# Patient Record
Sex: Female | Born: 1970 | Race: White | Hispanic: No | Smoking: Never smoker
Health system: Southern US, Community
[De-identification: ages and names within clinical notes are randomized; demographics above are authoritative.]

## PROBLEM LIST (undated history)

## (undated) DIAGNOSIS — F419 Anxiety disorder, unspecified: Secondary | ICD-10-CM

## (undated) DIAGNOSIS — J45909 Unspecified asthma, uncomplicated: Secondary | ICD-10-CM

## (undated) HISTORY — PX: ABDOMINAL HYSTERECTOMY: SHX81

---

## 2004-05-01 ENCOUNTER — Emergency Department: Payer: Self-pay | Admitting: Emergency Medicine

## 2004-08-14 ENCOUNTER — Emergency Department: Payer: Self-pay | Admitting: Emergency Medicine

## 2005-01-07 ENCOUNTER — Emergency Department: Payer: Self-pay | Admitting: Emergency Medicine

## 2005-02-07 ENCOUNTER — Emergency Department: Payer: Self-pay | Admitting: Emergency Medicine

## 2005-04-11 ENCOUNTER — Emergency Department: Payer: Self-pay | Admitting: General Practice

## 2007-05-31 DIAGNOSIS — S21009A Unspecified open wound of unspecified breast, initial encounter: Secondary | ICD-10-CM | POA: Insufficient documentation

## 2007-08-06 DIAGNOSIS — J45909 Unspecified asthma, uncomplicated: Secondary | ICD-10-CM | POA: Insufficient documentation

## 2008-05-27 ENCOUNTER — Ambulatory Visit: Payer: Self-pay

## 2008-08-08 DIAGNOSIS — L0292 Furuncle, unspecified: Secondary | ICD-10-CM | POA: Insufficient documentation

## 2008-12-19 DIAGNOSIS — L679 Hair color and hair shaft abnormality, unspecified: Secondary | ICD-10-CM | POA: Insufficient documentation

## 2008-12-19 DIAGNOSIS — R519 Headache, unspecified: Secondary | ICD-10-CM | POA: Insufficient documentation

## 2009-01-06 DIAGNOSIS — E663 Overweight: Secondary | ICD-10-CM | POA: Insufficient documentation

## 2009-03-20 ENCOUNTER — Ambulatory Visit: Payer: Self-pay | Admitting: Family Medicine

## 2009-04-11 HISTORY — PX: CARPAL TUNNEL RELEASE: SHX101

## 2009-09-10 DIAGNOSIS — G4700 Insomnia, unspecified: Secondary | ICD-10-CM | POA: Insufficient documentation

## 2009-10-26 DIAGNOSIS — N951 Menopausal and female climacteric states: Secondary | ICD-10-CM | POA: Insufficient documentation

## 2009-11-09 DIAGNOSIS — M2550 Pain in unspecified joint: Secondary | ICD-10-CM | POA: Insufficient documentation

## 2009-11-09 LAB — POCT ERYTHROCYTE SEDIMENTATION RATE, NON-AUTOMATED: Sed Rate: 8 mm

## 2010-04-07 ENCOUNTER — Emergency Department: Payer: Self-pay | Admitting: Emergency Medicine

## 2010-07-05 ENCOUNTER — Ambulatory Visit: Payer: Self-pay | Admitting: Family Medicine

## 2010-09-20 ENCOUNTER — Ambulatory Visit: Payer: Self-pay | Admitting: Orthopedic Surgery

## 2011-03-08 LAB — HEPATIC FUNCTION PANEL
ALT: 16 U/L (ref 7–35)
AST: 15 U/L (ref 13–35)
Alkaline Phosphatase: 68 U/L (ref 25–125)
Bilirubin, Total: 0.3 mg/dL

## 2011-03-08 LAB — CBC AND DIFFERENTIAL
HCT: 42 % (ref 36–46)
Hemoglobin: 14.4 g/dL (ref 12.0–16.0)
Neutrophils Absolute: 55 /uL
Platelets: 247 10*3/uL (ref 150–399)
WBC: 6.3 10^3/mL

## 2011-03-08 LAB — BASIC METABOLIC PANEL
BUN: 15 mg/dL (ref 4–21)
Glucose: 1 mg/dL
Potassium: 3.8 mmol/L (ref 3.4–5.3)
Sodium: 141 mmol/L (ref 137–147)

## 2011-03-08 LAB — TSH: TSH: 1.67 u[IU]/mL (ref 0.41–5.90)

## 2011-10-02 ENCOUNTER — Emergency Department: Payer: Self-pay | Admitting: Emergency Medicine

## 2011-10-02 LAB — BASIC METABOLIC PANEL
Anion Gap: 12 (ref 7–16)
BUN: 11 mg/dL (ref 7–18)
Calcium, Total: 8.6 mg/dL (ref 8.5–10.1)
Chloride: 106 mmol/L (ref 98–107)
Co2: 25 mmol/L (ref 21–32)
Creatinine: 0.72 mg/dL (ref 0.60–1.30)
EGFR (African American): >60
EGFR (Non-African Amer.): >60
Glucose: 87 mg/dL (ref 65–99)
Osmolality: 284 (ref 275–301)
Potassium: 3.6 mmol/L (ref 3.5–5.1)
Sodium: 143 mmol/L (ref 136–145)

## 2011-10-02 LAB — TROPONIN I: Troponin-I: <0.02 ng/mL

## 2011-10-02 LAB — CK TOTAL AND CKMB (NOT AT ARMC)
CK, Total: 177 U/L (ref 21–215)
CK-MB: 2.6 ng/mL (ref 0.5–3.6)

## 2011-10-02 LAB — CBC
HCT: 40.9 % (ref 35.0–47.0)
HGB: 13.9 g/dL (ref 12.0–16.0)
MCH: 28.8 pg (ref 26.0–34.0)
MCHC: 34 g/dL (ref 32.0–36.0)
MCV: 85 fL (ref 80–100)
Platelet: 227 10*3/uL (ref 150–440)
RBC: 4.84 10*6/uL (ref 3.80–5.20)
RDW: 13 % (ref 11.5–14.5)
WBC: 5.9 10*3/uL (ref 3.6–11.0)

## 2012-09-10 ENCOUNTER — Ambulatory Visit: Payer: Self-pay | Admitting: Obstetrics and Gynecology

## 2012-09-10 LAB — BASIC METABOLIC PANEL
Anion Gap: 4 — ABNORMAL LOW (ref 7–16)
BUN: 15 mg/dL (ref 7–18)
Calcium, Total: 8.8 mg/dL (ref 8.5–10.1)
Chloride: 104 mmol/L (ref 98–107)
Co2: 30 mmol/L (ref 21–32)
Creatinine: 0.82 mg/dL (ref 0.60–1.30)
EGFR (African American): >60
EGFR (Non-African Amer.): >60
Glucose: 89 mg/dL (ref 65–99)
Osmolality: 276 (ref 275–301)
Potassium: 3.9 mmol/L (ref 3.5–5.1)
Sodium: 138 mmol/L (ref 136–145)

## 2012-09-10 LAB — CBC
HCT: 39.3 % (ref 35.0–47.0)
HGB: 13.5 g/dL (ref 12.0–16.0)
MCH: 28.5 pg (ref 26.0–34.0)
MCHC: 34.3 g/dL (ref 32.0–36.0)
MCV: 83 fL (ref 80–100)
Platelet: 204 10*3/uL (ref 150–440)
RBC: 4.73 10*6/uL (ref 3.80–5.20)
RDW: 13.1 % (ref 11.5–14.5)
WBC: 5.4 10*3/uL (ref 3.6–11.0)

## 2012-09-20 ENCOUNTER — Ambulatory Visit: Payer: Self-pay | Admitting: Obstetrics and Gynecology

## 2012-09-20 HISTORY — PX: INCONTINENCE SURGERY: SHX676

## 2012-10-01 ENCOUNTER — Emergency Department: Payer: Self-pay | Admitting: Emergency Medicine

## 2012-10-01 LAB — CBC WITH DIFFERENTIAL/PLATELET
Basophil #: 0.1 10*3/uL (ref 0.0–0.1)
Basophil %: 1.1 %
Eosinophil #: 0.3 10*3/uL (ref 0.0–0.7)
Eosinophil %: 5.2 %
HCT: 38 % (ref 35.0–47.0)
HGB: 13.2 g/dL (ref 12.0–16.0)
Lymphocyte #: 2 10*3/uL (ref 1.0–3.6)
Lymphocyte %: 36.6 %
MCH: 28.7 pg (ref 26.0–34.0)
MCHC: 34.8 g/dL (ref 32.0–36.0)
MCV: 83 fL (ref 80–100)
Monocyte #: 0.3 x10 3/mm (ref 0.2–0.9)
Monocyte %: 6.4 %
Neutrophil #: 2.7 10*3/uL (ref 1.4–6.5)
Neutrophil %: 50.7 %
Platelet: 231 10*3/uL (ref 150–440)
RBC: 4.6 10*6/uL (ref 3.80–5.20)
RDW: 13 % (ref 11.5–14.5)
WBC: 5.3 10*3/uL (ref 3.6–11.0)

## 2012-10-01 LAB — BASIC METABOLIC PANEL
Anion Gap: 4 — ABNORMAL LOW (ref 7–16)
BUN: 18 mg/dL (ref 7–18)
Calcium, Total: 9 mg/dL (ref 8.5–10.1)
Chloride: 105 mmol/L (ref 98–107)
Co2: 30 mmol/L (ref 21–32)
Creatinine: 0.8 mg/dL (ref 0.60–1.30)
EGFR (African American): >60
EGFR (Non-African Amer.): >60
Glucose: 101 mg/dL — ABNORMAL HIGH (ref 65–99)
Osmolality: 280 (ref 275–301)
Potassium: 3.8 mmol/L (ref 3.5–5.1)
Sodium: 139 mmol/L (ref 136–145)

## 2012-10-01 LAB — SEDIMENTATION RATE: Erythrocyte Sed Rate: 18 mm/hr (ref 0–20)

## 2012-10-11 ENCOUNTER — Other Ambulatory Visit: Payer: Self-pay | Admitting: Neurosurgery

## 2012-10-11 ENCOUNTER — Encounter (HOSPITAL_COMMUNITY): Payer: Self-pay | Admitting: Certified Registered Nurse Anesthetist

## 2012-10-11 ENCOUNTER — Encounter (HOSPITAL_COMMUNITY): Payer: Self-pay | Admitting: Pharmacy Technician

## 2012-10-11 ENCOUNTER — Encounter (HOSPITAL_COMMUNITY)
Admission: AD | Disposition: A | Discharge status: Home / Self Care | Payer: Self-pay | Source: Ambulatory Visit | Attending: Neurosurgery

## 2012-10-11 ENCOUNTER — Ambulatory Visit (HOSPITAL_COMMUNITY): Payer: BC Managed Care – PPO

## 2012-10-11 ENCOUNTER — Encounter (HOSPITAL_COMMUNITY): Payer: Self-pay | Admitting: *Deleted

## 2012-10-11 ENCOUNTER — Ambulatory Visit (HOSPITAL_COMMUNITY): Payer: BC Managed Care – PPO | Admitting: Certified Registered Nurse Anesthetist

## 2012-10-11 ENCOUNTER — Inpatient Hospital Stay (HOSPITAL_COMMUNITY)
Admission: AD | Admit: 2012-10-11 | Discharge: 2012-10-13 | DRG: 865 | Disposition: A | Discharge status: Home / Self Care | Payer: BC Managed Care – PPO | Source: Ambulatory Visit | Attending: Neurosurgery | Admitting: Neurosurgery

## 2012-10-11 DIAGNOSIS — J45909 Unspecified asthma, uncomplicated: Secondary | ICD-10-CM | POA: Diagnosis present

## 2012-10-11 DIAGNOSIS — Z794 Long term (current) use of insulin: Secondary | ICD-10-CM

## 2012-10-11 DIAGNOSIS — Z880 Allergy status to penicillin: Secondary | ICD-10-CM

## 2012-10-11 DIAGNOSIS — M4802 Spinal stenosis, cervical region: Principal | ICD-10-CM | POA: Diagnosis present

## 2012-10-11 DIAGNOSIS — F411 Generalized anxiety disorder: Secondary | ICD-10-CM | POA: Diagnosis present

## 2012-10-11 HISTORY — PX: ANTERIOR CERVICAL DECOMP/DISCECTOMY FUSION: SHX1161

## 2012-10-11 HISTORY — DX: Unspecified asthma, uncomplicated: J45.909

## 2012-10-11 HISTORY — DX: Anxiety disorder, unspecified: F41.9

## 2012-10-11 LAB — CBC
HCT: 40.3 % (ref 36.0–46.0)
Hemoglobin: 13.7 g/dL (ref 12.0–15.0)
MCH: 28.1 pg (ref 26.0–34.0)
MCHC: 34 g/dL (ref 30.0–36.0)
MCV: 82.8 fL (ref 78.0–100.0)
Platelets: 211 10*3/uL (ref 150–400)
RBC: 4.87 MIL/uL (ref 3.87–5.11)
RDW: 12.5 % (ref 11.5–15.5)
WBC: 6.9 10*3/uL (ref 4.0–10.5)

## 2012-10-11 LAB — SURGICAL PCR SCREEN
MRSA, PCR: NEGATIVE
Staphylococcus aureus: NEGATIVE

## 2012-10-11 SURGERY — ANTERIOR CERVICAL DECOMPRESSION/DISCECTOMY FUSION 2 LEVELS
Anesthesia: General | Wound class: Clean

## 2012-10-11 MED ORDER — DIAZEPAM 5 MG PO TABS
5.0000 mg | ORAL_TABLET | Freq: Four times a day (QID) | ORAL | Status: DC | PRN
  Administered 2012-10-12 (×3): 5 mg via ORAL
  Filled 2012-10-11 (×3): qty 1

## 2012-10-11 MED ORDER — SODIUM CHLORIDE 0.9 % IV SOLN: 250.0000 mL | INTRAVENOUS | Status: DC

## 2012-10-11 MED ORDER — ACETAMINOPHEN 650 MG RE SUPP: 650.0000 mg | RECTAL | Status: DC | PRN

## 2012-10-11 MED ORDER — ONDANSETRON HCL 4 MG/2ML IJ SOLN
INTRAMUSCULAR | Status: DC | PRN
  Administered 2012-10-11: 4 mg via INTRAVENOUS

## 2012-10-11 MED ORDER — PHENOL 1.4 % MT LIQD: 1.0000 | OROMUCOSAL | Status: DC | PRN

## 2012-10-11 MED ORDER — OXYCODONE-ACETAMINOPHEN 5-325 MG PO TABS
1.0000 | ORAL_TABLET | ORAL | Status: DC | PRN
  Administered 2012-10-13 (×2): 2 via ORAL
  Filled 2012-10-11 (×3): qty 2

## 2012-10-11 MED ORDER — SODIUM CHLORIDE 0.9 % IJ SOLN
3.0000 mL | Freq: Two times a day (BID) | INTRAMUSCULAR | Status: DC
  Administered 2012-10-11: 3 mL via INTRAVENOUS

## 2012-10-11 MED ORDER — MORPHINE SULFATE 2 MG/ML IJ SOLN
1.0000 mg | INTRAMUSCULAR | Status: DC | PRN
  Administered 2012-10-11 – 2012-10-12 (×2): 4 mg via INTRAVENOUS
  Administered 2012-10-12: 2 mg via INTRAVENOUS
  Administered 2012-10-12: 4 mg via INTRAVENOUS
  Administered 2012-10-12: 2 mg via INTRAVENOUS
  Filled 2012-10-11: qty 1
  Filled 2012-10-11 (×3): qty 2
  Filled 2012-10-11: qty 1

## 2012-10-11 MED ORDER — HYDROMORPHONE HCL PF 1 MG/ML IJ SOLN
INTRAMUSCULAR | Status: DC | PRN
  Administered 2012-10-11: 0.5 mg via INTRAVENOUS

## 2012-10-11 MED ORDER — HYDROXYZINE HCL 50 MG PO TABS
50.0000 mg | ORAL_TABLET | Freq: Three times a day (TID) | ORAL | Status: DC | PRN
  Filled 2012-10-11: qty 1

## 2012-10-11 MED ORDER — EPHEDRINE SULFATE 50 MG/ML IJ SOLN
INTRAMUSCULAR | Status: DC | PRN
  Administered 2012-10-11: 5 mg via INTRAVENOUS

## 2012-10-11 MED ORDER — SODIUM CHLORIDE 0.9 % IJ SOLN: 3.0000 mL | INTRAMUSCULAR | Status: DC | PRN

## 2012-10-11 MED ORDER — ROCURONIUM BROMIDE 100 MG/10ML IV SOLN
INTRAVENOUS | Status: DC | PRN
  Administered 2012-10-11: 50 mg via INTRAVENOUS

## 2012-10-11 MED ORDER — OXYCODONE HCL 5 MG PO TABS
ORAL_TABLET | ORAL | Status: AC
  Filled 2012-10-11: qty 1

## 2012-10-11 MED ORDER — VANCOMYCIN HCL IN DEXTROSE 1-5 GM/200ML-% IV SOLN
1000.0000 mg | Freq: Once | INTRAVENOUS | Status: AC
  Administered 2012-10-11: 1000 mg via INTRAVENOUS

## 2012-10-11 MED ORDER — SERTRALINE HCL 100 MG PO TABS
200.0000 mg | ORAL_TABLET | Freq: Every day | ORAL | Status: DC
  Administered 2012-10-11 – 2012-10-13 (×4): 200 mg via ORAL
  Filled 2012-10-11 (×4): qty 2

## 2012-10-11 MED ORDER — MUPIROCIN 2 % EX OINT
TOPICAL_OINTMENT | CUTANEOUS | Status: AC
  Administered 2012-10-11: 1 via NASAL
  Filled 2012-10-11: qty 22

## 2012-10-11 MED ORDER — PROMETHAZINE HCL 25 MG/ML IJ SOLN: 6.2500 mg | INTRAMUSCULAR | Status: DC | PRN

## 2012-10-11 MED ORDER — PROPOFOL 10 MG/ML IV BOLUS
INTRAVENOUS | Status: DC | PRN
  Administered 2012-10-11: 120 mg via INTRAVENOUS

## 2012-10-11 MED ORDER — NEOSTIGMINE METHYLSULFATE 1 MG/ML IJ SOLN
INTRAMUSCULAR | Status: DC | PRN
  Administered 2012-10-11: 4 mg via INTRAVENOUS

## 2012-10-11 MED ORDER — ZOLPIDEM TARTRATE 5 MG PO TABS: 5.0000 mg | ORAL_TABLET | Freq: Every evening | ORAL | Status: DC | PRN

## 2012-10-11 MED ORDER — OXYCODONE HCL 5 MG PO TABS
5.0000 mg | ORAL_TABLET | Freq: Once | ORAL | Status: AC | PRN
  Administered 2012-10-11: 5 mg via ORAL

## 2012-10-11 MED ORDER — FENTANYL CITRATE 0.05 MG/ML IJ SOLN
INTRAMUSCULAR | Status: DC | PRN
  Administered 2012-10-11 (×2): 50 ug via INTRAVENOUS
  Administered 2012-10-11: 100 ug via INTRAVENOUS
  Administered 2012-10-11: 50 ug via INTRAVENOUS
  Administered 2012-10-11: 250 ug via INTRAVENOUS

## 2012-10-11 MED ORDER — THROMBIN 5000 UNITS EX SOLR
OROMUCOSAL | Status: DC | PRN
  Administered 2012-10-11: 14:00:00 via TOPICAL

## 2012-10-11 MED ORDER — MIDAZOLAM HCL 2 MG/2ML IJ SOLN: 0.5000 mg | Freq: Once | INTRAMUSCULAR | Status: DC | PRN

## 2012-10-11 MED ORDER — THROMBIN 20000 UNITS EX SOLR
CUTANEOUS | Status: DC | PRN
  Administered 2012-10-11: 14:00:00 via TOPICAL

## 2012-10-11 MED ORDER — OXYCODONE HCL 5 MG/5ML PO SOLN: 5.0000 mg | Freq: Once | ORAL | Status: AC | PRN

## 2012-10-11 MED ORDER — ONDANSETRON HCL 4 MG/2ML IJ SOLN: 4.0000 mg | INTRAMUSCULAR | Status: DC | PRN

## 2012-10-11 MED ORDER — ACETAMINOPHEN 325 MG PO TABS: 650.0000 mg | ORAL_TABLET | ORAL | Status: DC | PRN

## 2012-10-11 MED ORDER — MEPERIDINE HCL 25 MG/ML IJ SOLN: 6.2500 mg | INTRAMUSCULAR | Status: DC | PRN

## 2012-10-11 MED ORDER — TRAZODONE HCL 100 MG PO TABS
200.0000 mg | ORAL_TABLET | Freq: Every day | ORAL | Status: DC
  Administered 2012-10-11 – 2012-10-12 (×2): 200 mg via ORAL
  Filled 2012-10-11 (×3): qty 2

## 2012-10-11 MED ORDER — MIDAZOLAM HCL 5 MG/5ML IJ SOLN
INTRAMUSCULAR | Status: DC | PRN
  Administered 2012-10-11: 2 mg via INTRAVENOUS

## 2012-10-11 MED ORDER — 0.9 % SODIUM CHLORIDE (POUR BTL) OPTIME
TOPICAL | Status: DC | PRN
  Administered 2012-10-11: 1000 mL

## 2012-10-11 MED ORDER — LACTATED RINGERS IV SOLN
INTRAVENOUS | Status: DC | PRN
  Administered 2012-10-11 (×2): via INTRAVENOUS

## 2012-10-11 MED ORDER — ZOLPIDEM TARTRATE 10 MG PO TABS: 10.0000 mg | ORAL_TABLET | Freq: Every evening | ORAL | Status: DC | PRN

## 2012-10-11 MED ORDER — MENTHOL 3 MG MT LOZG: 1.0000 | LOZENGE | OROMUCOSAL | Status: DC | PRN

## 2012-10-11 MED ORDER — LIDOCAINE HCL (CARDIAC) 20 MG/ML IV SOLN
INTRAVENOUS | Status: DC | PRN
  Administered 2012-10-11: 20 mg via INTRAVENOUS

## 2012-10-11 MED ORDER — ALBUTEROL SULFATE HFA 108 (90 BASE) MCG/ACT IN AERS
2.0000 | INHALATION_SPRAY | Freq: Four times a day (QID) | RESPIRATORY_TRACT | Status: DC | PRN
  Filled 2012-10-11: qty 6.7

## 2012-10-11 MED ORDER — MOMETASONE FURO-FORMOTEROL FUM 100-5 MCG/ACT IN AERO
2.0000 | INHALATION_SPRAY | Freq: Two times a day (BID) | RESPIRATORY_TRACT | Status: DC
  Administered 2012-10-11 – 2012-10-12 (×2): 2 via RESPIRATORY_TRACT
  Filled 2012-10-11 (×2): qty 8.8

## 2012-10-11 MED ORDER — HYDROMORPHONE HCL PF 1 MG/ML IJ SOLN
INTRAMUSCULAR | Status: AC
  Filled 2012-10-11: qty 1

## 2012-10-11 MED ORDER — MUPIROCIN 2 % EX OINT
TOPICAL_OINTMENT | Freq: Two times a day (BID) | CUTANEOUS | Status: DC
Start: 2012-10-11 — End: 2012-10-11

## 2012-10-11 MED ORDER — HYDROMORPHONE HCL PF 1 MG/ML IJ SOLN
0.2500 mg | INTRAMUSCULAR | Status: DC | PRN
  Administered 2012-10-11 (×2): 0.5 mg via INTRAVENOUS

## 2012-10-11 MED ORDER — LACTULOSE 10 GM/15ML PO SOLN
20.0000 g | Freq: Three times a day (TID) | ORAL | Status: DC
  Administered 2012-10-11 – 2012-10-13 (×4): 20 g via ORAL
  Filled 2012-10-11 (×8): qty 30

## 2012-10-11 MED ORDER — SODIUM CHLORIDE 0.9 % IV SOLN
INTRAVENOUS | Status: DC
  Administered 2012-10-11: 18:00:00 via INTRAVENOUS

## 2012-10-11 MED ORDER — DEXAMETHASONE SODIUM PHOSPHATE 4 MG/ML IJ SOLN
4.0000 mg | Freq: Four times a day (QID) | INTRAMUSCULAR | Status: DC
  Administered 2012-10-13: 4 mg via INTRAVENOUS
  Filled 2012-10-11 (×9): qty 1

## 2012-10-11 MED ORDER — DEXAMETHASONE SODIUM PHOSPHATE 4 MG/ML IJ SOLN
INTRAMUSCULAR | Status: DC | PRN
  Administered 2012-10-11: 8 mg via INTRAVENOUS

## 2012-10-11 MED ORDER — DEXAMETHASONE 4 MG PO TABS
4.0000 mg | ORAL_TABLET | Freq: Four times a day (QID) | ORAL | Status: DC
  Administered 2012-10-11 – 2012-10-13 (×5): 4 mg via ORAL
  Filled 2012-10-11 (×11): qty 1

## 2012-10-11 MED ORDER — GLYCOPYRROLATE 0.2 MG/ML IJ SOLN
INTRAMUSCULAR | Status: DC | PRN
  Administered 2012-10-11: 0.6 mg via INTRAVENOUS

## 2012-10-11 SURGICAL SUPPLY — 50 items
BANDAGE GAUZE ELAST BULKY 4 IN (GAUZE/BANDAGES/DRESSINGS) ×4 IMPLANT
BENZOIN TINCTURE PRP APPL 2/3 (GAUZE/BANDAGES/DRESSINGS) ×2 IMPLANT
BIT DRILL SM SPINE QC 12 (BIT) ×2 IMPLANT
BLADE ULTRA TIP 2M (BLADE) ×2 IMPLANT
BUR BARREL STRAIGHT FLUTE 4.0 (BURR) IMPLANT
BUR MATCHSTICK NEURO 3.0 LAGG (BURR) ×2 IMPLANT
CANISTER SUCTION 2500CC (MISCELLANEOUS) ×2 IMPLANT
CLOTH BEACON ORANGE TIMEOUT ST (SAFETY) ×2 IMPLANT
CONT SPEC 4OZ CLIKSEAL STRL BL (MISCELLANEOUS) ×2 IMPLANT
COVER MAYO STAND STRL (DRAPES) ×2 IMPLANT
DRAPE C-ARM 42X72 X-RAY (DRAPES) ×4 IMPLANT
DRAPE LAPAROTOMY 100X72 PEDS (DRAPES) ×2 IMPLANT
DRAPE MICROSCOPE LEICA (MISCELLANEOUS) ×2 IMPLANT
DRAPE POUCH INSTRU U-SHP 10X18 (DRAPES) ×2 IMPLANT
DURAPREP 6ML APPLICATOR 50/CS (WOUND CARE) ×2 IMPLANT
ELECT REM PT RETURN 9FT ADLT (ELECTROSURGICAL) ×2
ELECTRODE REM PT RTRN 9FT ADLT (ELECTROSURGICAL) ×1 IMPLANT
GAUZE SPONGE 4X4 16PLY XRAY LF (GAUZE/BANDAGES/DRESSINGS) IMPLANT
GLOVE BIOGEL M 8.0 STRL (GLOVE) ×2 IMPLANT
GLOVE EXAM NITRILE LRG STRL (GLOVE) IMPLANT
GLOVE EXAM NITRILE MD LF STRL (GLOVE) ×2 IMPLANT
GLOVE EXAM NITRILE XL STR (GLOVE) IMPLANT
GLOVE EXAM NITRILE XS STR PU (GLOVE) IMPLANT
GOWN BRE IMP SLV AUR LG STRL (GOWN DISPOSABLE) ×2 IMPLANT
GOWN BRE IMP SLV AUR XL STRL (GOWN DISPOSABLE) IMPLANT
GOWN STRL REIN 2XL LVL4 (GOWN DISPOSABLE) ×4 IMPLANT
HEAD HALTER (SOFTGOODS) ×2 IMPLANT
HEMOSTAT POWDER KIT SURGIFOAM (HEMOSTASIS) IMPLANT
KIT BASIN OR (CUSTOM PROCEDURE TRAY) ×2 IMPLANT
KIT ROOM TURNOVER OR (KITS) ×2 IMPLANT
NEEDLE SPNL 22GX3.5 QUINCKE BK (NEEDLE) ×4 IMPLANT
NS IRRIG 1000ML POUR BTL (IV SOLUTION) ×2 IMPLANT
PACK LAMINECTOMY NEURO (CUSTOM PROCEDURE TRAY) ×2 IMPLANT
PATTIES SURGICAL .5 X1 (DISPOSABLE) ×2 IMPLANT
PLATE ANT CERV XTEND 2 LV 32 (Plate) ×2 IMPLANT
PUTTY BONE GRAFT KIT 2.5ML (Bone Implant) ×2 IMPLANT
RUBBERBAND STERILE (MISCELLANEOUS) ×4 IMPLANT
SCREW SELF TAP VARIABLE 4.6X12 (Screw) ×2 IMPLANT
SCREW XTD VAR 4.2 SELF TAP 12 (Screw) ×10 IMPLANT
SPACER ACDF SM LORDOTIC 7 (Spacer) ×4 IMPLANT
SPONGE GAUZE 4X4 12PLY (GAUZE/BANDAGES/DRESSINGS) ×2 IMPLANT
SPONGE INTESTINAL PEANUT (DISPOSABLE) ×2 IMPLANT
SPONGE SURGIFOAM ABS GEL SZ50 (HEMOSTASIS) ×2 IMPLANT
STRIP CLOSURE SKIN 1/2X4 (GAUZE/BANDAGES/DRESSINGS) ×2 IMPLANT
SUT VIC AB 3-0 SH 8-18 (SUTURE) ×2 IMPLANT
SYR 20ML ECCENTRIC (SYRINGE) ×2 IMPLANT
TAPE CLOTH SURG 4X10 WHT LF (GAUZE/BANDAGES/DRESSINGS) ×2 IMPLANT
TOWEL OR 17X24 6PK STRL BLUE (TOWEL DISPOSABLE) ×2 IMPLANT
TOWEL OR 17X26 10 PK STRL BLUE (TOWEL DISPOSABLE) ×2 IMPLANT
WATER STERILE IRR 1000ML POUR (IV SOLUTION) ×2 IMPLANT

## 2012-10-11 NOTE — Plan of Care (Signed)
Problem: Consults Goal: Diagnosis - Spinal Surgery Cervical Spine Fusion     

## 2012-10-11 NOTE — Anesthesia Procedure Notes (Signed)
Procedure Name: Intubation Date/Time: 10/11/2012 2:11 PM Performed by: Margaree Mackintosh Pre-anesthesia Checklist: Patient identified, Emergency Drugs available, Suction available, Patient being monitored and Timeout performed Patient Re-evaluated:Patient Re-evaluated prior to inductionOxygen Delivery Method: Circle system utilized Preoxygenation: Pre-oxygenation with 100% oxygen Intubation Type: IV induction Ventilation: Mask ventilation without difficulty Laryngoscope Size: Mac and 3 Grade View: Grade I Tube type: Oral Tube size: 7.0 mm Number of attempts: 1 Airway Equipment and Method: Stylet and LTA kit utilized Placement Confirmation: ETT inserted through vocal cords under direct vision,  positive ETCO2 and breath sounds checked- equal and bilateral Secured at: 20 cm Tube secured with: Tape Dental Injury: Teeth and Oropharynx as per pre-operative assessment

## 2012-10-11 NOTE — Anesthesia Preprocedure Evaluation (Addendum)
Anesthesia Evaluation  Patient identified by MRN, date of birth, ID band Patient awake  General Assessment Comment:Pt ate cereal at 0730  Reviewed: Allergy & Precautions, H&P , NPO status , Patient's Chart, lab work & pertinent test results  History of Anesthesia Complications Negative for: history of anesthetic complications  Airway Mallampati: I TM Distance: >3 FB Neck ROM: Full    Dental  (+) Poor Dentition, Missing, Chipped and Dental Advisory Given   Pulmonary asthma ,  Advair daily regimen, well controlled no use of rescue inhaler recently breath sounds clear to auscultation  Pulmonary exam normal       Cardiovascular negative cardio ROS  Rhythm:Regular Rate:Normal     Neuro/Psych Anxiety negative neurological ROS     GI/Hepatic negative GI ROS, Neg liver ROS,   Endo/Other  Morbid obesity  Renal/GU negative Renal ROS     Musculoskeletal negative musculoskeletal ROS (+)   Abdominal (+) + obese,   Peds  Hematology negative hematology ROS (+)   Anesthesia Other Findings   Reproductive/Obstetrics S/p hysterectomy                         Anesthesia Physical Anesthesia Plan  ASA: III  Anesthesia Plan: General   Post-op Pain Management:    Induction: Intravenous  Airway Management Planned: Oral ETT  Additional Equipment:   Intra-op Plan:   Post-operative Plan:   Informed Consent: I have reviewed the patients History and Physical, chart, labs and discussed the procedure including the risks, benefits and alternatives for the proposed anesthesia with the patient or authorized representative who has indicated his/her understanding and acceptance.   Dental advisory given  Plan Discussed with: CRNA and Surgeon  Anesthesia Plan Comments: (Plan routine monitors, GETA)        Anesthesia Quick Evaluation

## 2012-10-11 NOTE — Anesthesia Postprocedure Evaluation (Signed)
  Anesthesia Post-op Note  Patient: Stephanie Spears  Procedure(s) Performed: Procedure(s) with comments: CERVICAL FIVE-SIX, CERVICAL SIX-SEVEN ANTERIOR CERVICAL DECOMPRESSION/DISCECTOMY FUSION 2 LEVELS (N/A) - C56 C67 anterior cervical decompression with fusion plating and bonegraft  Patient Location: PACU  Anesthesia Type:General  Level of Consciousness: awake, alert  and oriented  Airway and Oxygen Therapy: Patient Spontanous Breathing  Post-op Pain: mild  Post-op Assessment: Post-op Vital signs reviewed  Post-op Vital Signs: Reviewed  Complications: No apparent anesthesia complications

## 2012-10-11 NOTE — Progress Notes (Signed)
Op note [phone]

## 2012-10-11 NOTE — H&P (Signed)
Phinley Schall is an 42 y.o. female.   Chief Complaint: neck and left arm pain HPI: patient seen by dr Danielle Dess several days ago with sudden onset of necck and left arm pain associated with sensory changes. Medications has not helped and today she called because she was worse.   Past Medical History  Diagnosis Date  . Asthma   . Anxiety     Past Surgical History  Procedure Laterality Date  . Abdominal hysterectomy    . Incontinence surgery  09-20-2012  . Carpal tunnel release Right 2011    History reviewed. No pertinent family history. Social History:  reports that she has never smoked. She does not have any smokeless tobacco history on file. She reports that she does not drink alcohol or use illicit drugs.  Allergies:  Allergies  Allergen Reactions  . Aspirin Shortness Of Breath  . Penicillins Anaphylaxis    Medications Prior to Admission  Medication Sig Dispense Refill  . albuterol (PROVENTIL HFA;VENTOLIN HFA) 108 (90 BASE) MCG/ACT inhaler Inhale 2 puffs into the lungs every 6 (six) hours as needed for wheezing.      . diazepam (VALIUM) 5 MG tablet Take 5 mg by mouth every 6 (six) hours as needed for anxiety.      . Fluticasone-Salmeterol (ADVAIR DISKUS) 250-50 MCG/DOSE AEPB Inhale 1 puff into the lungs every 12 (twelve) hours.      . gabapentin (NEURONTIN) 300 MG capsule Take 300 mg by mouth 3 (three) times daily.      . hydrOXYzine (ATARAX/VISTARIL) 50 MG tablet Take 50 mg by mouth 3 (three) times daily as needed for itching.      Marland Kitchen ibuprofen (ADVIL,MOTRIN) 800 MG tablet Take 800 mg by mouth every 8 (eight) hours as needed for pain.      Marland Kitchen lactulose (CHRONULAC) 10 GM/15ML solution Take 20 g by mouth 3 (three) times daily.      . sertraline (ZOLOFT) 100 MG tablet Take 200 mg by mouth daily.      . traZODone (DESYREL) 100 MG tablet Take 200 mg by mouth at bedtime.        Results for orders placed during the hospital encounter of 10/11/12 (from the past 48 hour(s))  CBC      Status: None   Collection Time    10/11/12 11:40 AM      Result Value Range   WBC 6.9  4.0 - 10.5 K/uL   RBC 4.87  3.87 - 5.11 MIL/uL   Hemoglobin 13.7  12.0 - 15.0 g/dL   HCT 16.1  09.6 - 04.5 %   MCV 82.8  78.0 - 100.0 fL   MCH 28.1  26.0 - 34.0 pg   MCHC 34.0  30.0 - 36.0 g/dL   RDW 40.9  81.1 - 91.4 %   Platelets 211  150 - 400 K/uL   No results found.  Review of Systems  Constitutional: Negative.   HENT: Positive for neck pain.   Eyes: Negative.   Respiratory:       Asthma  Cardiovascular: Negative.   Gastrointestinal: Negative.   Genitourinary: Negative.   Skin: Negative.   Neurological: Positive for sensory change and focal weakness.  Psychiatric/Behavioral: Positive for depression.    Blood pressure 102/71, pulse 75, temperature 97.1 F (36.2 C), temperature source Oral, resp. rate 20, height 4\' 11"  (1.499 m), weight 81.2 kg (179 lb 0.2 oz), SpO2 97.00%. Physical Exam hent, nl. Neck, pain with movements. She is holding her left arm on top  of her head to relief the pain. Cv, nl. Lung, clear. Abdomen, soft. Extremities, see above. NEURO weakness of left biceps and triceps , semsory changes involving the left thumb, index and middle finger. Absent of left triceps and biceps reflexes mri of the cervical spine shows severe doraminal stenosis at cervcal 56 and 67 spaces. At c4-5 she shows  foraminal stenosis to the right.   Assessment/Plan spoke the plan is to go ahead with two levels dic with her and mother in my office and showed the pictures and the report. The plan is to go ahead with two levels decompression and fusion at 56, 67 since her symptoms are getting worse and now includes clinically the 67 area. Both are aware of risks and benefits. Also i gave them literature to read. They want to go ahead today  Zamira Hickam M 10/11/2012, 12:06 PM

## 2012-10-11 NOTE — Transfer of Care (Signed)
Immediate Anesthesia Transfer of Care Note  Patient: Stephanie Spears  Procedure(s) Performed: Procedure(s) with comments: CERVICAL FIVE-SIX, CERVICAL SIX-SEVEN ANTERIOR CERVICAL DECOMPRESSION/DISCECTOMY FUSION 2 LEVELS (N/A) - C56 C67 anterior cervical decompression with fusion plating and bonegraft  Patient Location: PACU  Anesthesia Type:General  Level of Consciousness: awake, alert  and oriented  Airway & Oxygen Therapy: Patient Spontanous Breathing and Patient connected to face mask oxygen  Post-op Assessment: Report given to PACU RN, Post -op Vital signs reviewed and stable and Patient moving all extremities X 4  Post vital signs: Reviewed and stable  Complications: No apparent anesthesia complications

## 2012-10-11 NOTE — Preoperative (Signed)
Beta Blockers   Reason not to administer Beta Blockers:Not Applicable 

## 2012-10-12 NOTE — Evaluation (Addendum)
Physical Therapy Evaluation Patient Details Name: Stephanie Spears MRN: [id number] DOB: 01/10/1971 Today's Date: 10/12/2012 Time: 1308-6578 PT Time Calculation (min): 34 min  PT Assessment / Plan / Recommendation History of Present Illness  This 42 y.o. female admitted for Anterior C5-C6, C6-C7 diskectomy, decompression  and fusion  Clinical Impression  Ready for D/C soon, needs 1 more session with emphasis on bed mobility and reinforcing education    PT Assessment  Patient needs continued PT services    Follow Up Recommendations  No PT follow up;Supervision for mobility/OOB    Does the patient have the potential to tolerate intense rehabilitation      Barriers to Discharge        Equipment Recommendations  None recommended by PT    Recommendations for Other Services     Frequency Min 5X/week    Precautions / Restrictions Precautions Precautions: Cervical Precaution Comments: reviewed cervical precautions and she is able to return demonstration Restrictions Weight Bearing Restrictions: No   Pertinent Vitals/Pain 5-6/10 pain in/ around neck      Mobility  Bed Mobility Bed Mobility: Rolling Right;Right Sidelying to Sit;Sitting - Scoot to Edge of Bed;Sit to Supine Rolling Right: 5: Supervision;With rail Right Sidelying to Sit: 5: Supervision;With rails;HOB flat Sitting - Scoot to Edge of Bed: 5: Supervision Sit to Supine: 5: Supervision;With rail Details for Bed Mobility Assistance: Requires cues for precautions and technique Transfers Transfers: Sit to Stand;Stand to Sit Sit to Stand: 5: Supervision;With upper extremity assist;From bed;From toilet Stand to Sit: 5: Supervision;With upper extremity assist;To bed;To toilet Ambulation/Gait Ambulation/Gait Assistance: 5: Supervision Ambulation Distance (Feet): 200 Feet Assistive device: None Ambulation/Gait Assistance Details: steady gait  though mildly guarded Gait Pattern: Step-through pattern Stairs: Yes Stairs  Assistance: 4: Min guard Stair Management Technique: One rail Right;Step to pattern;Forwards Number of Stairs: 4    Exercises     PT Diagnosis: Acute pain;Generalized weakness  PT Problem List: Decreased strength;Decreased activity tolerance;Decreased mobility;Decreased knowledge of precautions;Decreased safety awareness;Pain PT Treatment Interventions: Gait training;Functional mobility training;Therapeutic activities;Patient/family education     PT Goals(Current goals can be found in the care plan section) Acute Rehab PT Goals Patient Stated Goal: back to work eventually PT Goal Formulation: With patient Time For Goal Achievement: 10/19/12 Potential to Achieve Goals: Good  Visit Information  Last PT Received On: 10/12/12 Assistance Needed: +1 History of Present Illness: This 42 y.o. female admitted for Anterior C5-C6, C6-C7 diskectomy, decompression  and fusion       Prior Functioning  Home Living Family/patient expects to be discharged to:: Private residence Living Arrangements: Other (Comment) (son and live in) Available Help at Discharge: Family;Available 24 hours/day Type of Home: House Home Access: Stairs to enter [employer] of Steps: 3 Entrance Stairs-Rails: Right;Left Home Layout: Two level;Able to live on main level with bedroom/bathroom Home Equipment: None Prior Function Level of Independence: Independent Communication Communication: No difficulties Dominant Hand: Right    Cognition  Cognition Arousal/Alertness: Awake/alert Behavior During Therapy: WFL for tasks assessed/performed Overall Cognitive Status: Within Functional Limits for tasks assessed    Extremity/Trunk Assessment Upper Extremity Assessment Upper Extremity Assessment: LUE deficits/detail LUE Deficits / Details: Pt reports pain Lt. UE - keeps Lt. UE positioned close to body - hesitant to reach with Lt. UE, but is demonstrates AROM and coordination WFL.  She reports mild numbness  Rt. index Lower Extremity Assessment Lower Extremity Assessment: Overall WFL for tasks assessed   Balance Balance Balance Assessed: Yes Dynamic Standing Balance Dynamic Standing - Balance Support:  No upper extremity supported Dynamic Standing - Level of Assistance: 5: Stand by assistance Dynamic Standing - Balance Activities: Other (comment) (grooming/ ADLs)  End of Session PT - End of Session Activity Tolerance: Patient tolerated treatment well Patient left: in bed;with call bell/phone within reach;with bed alarm set Nurse Communication: Mobility status  GP     Stephanie Spears, Stephanie Spears 10/12/2012, 2:07 PM  10/12/2012  Stephanie Spears, PT [phone]  (pager)

## 2012-10-12 NOTE — Progress Notes (Signed)
Patient ID: Stephanie Spears, female   DOB: May 08, 1970, 42 y.o.   MRN: [id number] Much better,no weakness, sensory better. Wound dry. Still, normal postop pain

## 2012-10-12 NOTE — Evaluation (Signed)
Occupational Therapy Evaluation Patient Details Name: Stephanie Spears MRN: [id number] DOB: 07/06/1970 Today's Date: 10/12/2012 Time: 4098-1191 OT Time Calculation (min): 35 min  OT Assessment / Plan / Recommendation History of present illness This 42 y.o. female admitted for Anterior C5-C6, C6-C7 diskectomy, decompression  and fusion   Clinical Impression   Pt is doing well first day post op.  She requires supervision/ set up for BADLs and functional mobility.  She demonstrates good understanding of cervical precautions.  All education completed, no further OT needs identified, will sign off.     OT Assessment  Patient does not need any further OT services    Follow Up Recommendations  No OT follow up;Supervision - Intermittent    Barriers to Discharge      Equipment Recommendations  None recommended by OT    Recommendations for Other Services    Frequency       Precautions / Restrictions Precautions Precautions: Cervical Precaution Comments: reviewed cervical precautions and she is able to return demonstration       ADL  Eating/Feeding: Modified independent Where Assessed - Eating/Feeding: Chair Grooming: Wash/dry hands;Supervision/safety Where Assessed - Grooming: Unsupported standing Upper Body Bathing: Set up;Supervision/safety Where Assessed - Upper Body Bathing: Unsupported sitting;Supported sitting Lower Body Bathing: Supervision/safety Where Assessed - Lower Body Bathing: Unsupported sit to stand Upper Body Dressing: Set up;Supervision/safety Where Assessed - Upper Body Dressing: Unsupported sitting Lower Body Dressing: Supervision/safety Where Assessed - Lower Body Dressing: Unsupported sit to stand Toilet Transfer: Supervision/safety Toilet Transfer Method: Sit to stand;Stand pivot Acupuncturist: Comfort height toilet;Regular height toilet Toileting - Clothing Manipulation and Hygiene: Supervision/safety Where Assessed - Medical sales representative and Hygiene: Standing Tub/Shower Transfer: Supervision/safety Tub/Shower Transfer Method: Science writer: Walk in shower Transfers/Ambulation Related to ADLs: supervision ADL Comments: Pt demonstrates good understanding of neck precautions.  Pt. instructed in precautions and safety    OT Diagnosis:    OT Problem List:   OT Treatment Interventions:     OT Goals(Current goals can be found in the care plan section)    Visit Information  Last OT Received On: 10/12/12 Assistance Needed: +1 PT/OT Co-Evaluation/Treatment: Yes History of Present Illness: This 42 y.o. female admitted for Anterior C5-C6, C6-C7 diskectomy, decompression  and fusion       Prior Functioning     Home Living Family/patient expects to be discharged to:: Private residence Living Arrangements: Other (Comment) (son and live in) Available Help at Discharge: Family;Available 24 hours/day Type of Home: House Home Access: Stairs to enter [employer] of Steps: 3 Entrance Stairs-Rails: Right;Left Home Layout: Two level;Able to live on main level with bedroom/bathroom Home Equipment: None Prior Function Level of Independence: Independent Communication Communication: No difficulties Dominant Hand: Right         Vision/Perception     Cognition  Cognition Arousal/Alertness: Awake/alert Behavior During Therapy: WFL for tasks assessed/performed Overall Cognitive Status: Within Functional Limits for tasks assessed    Extremity/Trunk Assessment Upper Extremity Assessment Upper Extremity Assessment: LUE deficits/detail LUE Deficits / Details: Pt reports pain Lt. UE - keeps Lt. UE positioned close to body - hesitant to reach with Lt. UE, but is demonstrates AROM and coordination WFL.  She reports mild numbness Rt. index     Mobility Bed Mobility Bed Mobility: Rolling Right;Right Sidelying to Sit;Sitting - Scoot to Edge of Bed;Sit to Supine Rolling Right: 5:  Supervision;With rail Right Sidelying to Sit: 5: Supervision;With rails;HOB flat Sitting - Scoot to Edge of Bed: 5: Supervision  Sit to Supine: 5: Supervision;With rail Details for Bed Mobility Assistance: Requires cues for precautions and technique Transfers Transfers: Sit to Stand;Stand to Sit Sit to Stand: 5: Supervision;With upper extremity assist;From bed;From toilet Stand to Sit: 5: Supervision;With upper extremity assist;To bed;To toilet     Exercise     Balance Balance Balance Assessed: Yes Dynamic Standing Balance Dynamic Standing - Balance Support: No upper extremity supported Dynamic Standing - Level of Assistance: 5: Stand by assistance Dynamic Standing - Balance Activities: Other (comment) (grooming/ ADLs)   End of Session OT - End of Session Activity Tolerance: Patient tolerated treatment well Patient left: in bed;with call bell/phone within reach;with bed alarm set Nurse Communication: Mobility status  GO     Hitesh Fouche, Ursula Alert M 10/12/2012, 12:50 PM

## 2012-10-12 NOTE — Progress Notes (Signed)
Covering Clinical Child psychotherapist (CSW) received am inappropriate referral for SNF placement. CSW confirmed with pt that pt will dc home with supportive family. Per ptt mother lives down the street and pt son and boyfriend live with pt. No CSW needs at this time please reconsult if any CSW needs arise. CSW signing off.  Theresia Bough, MSW, Theresia Majors [phone]

## 2012-10-12 NOTE — Op Note (Signed)
Stephanie Spears, GIPE NO.:  [id number]  MEDICAL RECORD NO.:  [id number]  LOCATION:  [id number]                        FACILITY:  [facility]  PHYSICIAN:  Hilda Lias, M.D.   DATE OF BIRTH:  10/07/1970  DATE OF PROCEDURE:  10/11/2012 DATE OF DISCHARGE:                              OPERATIVE REPORT   PREOPERATIVE DIAGNOSIS:  C5-C6, C6-C7 stenosis with acute radiculopathy left, weakness of the biceps and triceps.  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  PROCEDURE:  Anterior C5-C6, C6-C7 diskectomy, decompression of the spinal cord, bilateral foraminotomy, interbody fusion with cage plate, microscopy.  SURGEON:  Hilda Lias, MD.  CLINICAL HISTORY:  This patient, a 42 year old female, who was seen last week by Dr. Danielle Dess in the office.  She was complaining of pain going all the way down to the left upper extremity with weakness of the biceps. She called yesterday because she was getting worse and now she was having burning sensation involving the thumb index and middle finger. On looking at the MRI.  She had severe stenosis at C5-C6, C6-C7 with bilateral foraminal compromise at some foraminal compromise at C4-C5 mostly to the right side.  She has no pain in the right side.  Because of that, I talked to her and her mother this morning.  We agreed with surgery.  The procedure will be a two-level anterior cervical diskectomy and the risks were fully explained to both of them like infection, CSF leak, no improvement whatsoever, failure of the graft, need of further surgery, paralysis.  PROCEDURE NOTE:  The patient was taken to the OR and after intubation, we had to put some rolls under the shoulder to be able to build up the neck.  She has a short neck.  Having done that, the left side of the neck was cleaned with DuraPrep and drapes were applied.  Then, transverse incision was made through the skin, subcutaneous  tissue, platysma, straight down to the cervical spine.  I had used two needles and the needles were barely able to see C4-C5 that was the upper one. From then on, we identified C5-C6 and C6-7.  Then, we opened at the level of C5-C6 at the anterior ligament.  With the microscope, we proceed to rule out, total diskectomy.  We found the posterior ligament which was calcified and thick, it was going bilaterally worse to the left side.  Using the microhook as well as the 1 mm and 2 mm Kerrison punch, we were able to decompress the spinal cord on both C6 nerve root. The same finding of severe were also present at the level of C6-C7 with good decompression of the C7 nerve root at the end.  Then, the endplate were drilled.  Having good hemostasis and having good decompression, we introduced two cages of 7 mm height, lordotic with autograft bone extender inside.  This was followed by a plate using 6 screws. We did x-ray and we were able belly to see the top of the plate.  At least we knew that the plate was at the posterior side.  From then on, I waited 10 minute just to be sure  that there was good hemostasis and once it was achieved, the wound was closed with Vicryl and Steri-Strips.          ______________________________ Hilda Lias, M.D.     EB/MEDQ  D:  10/11/2012  T:  10/12/2012  Job:  [id number]

## 2012-10-13 NOTE — Discharge Summary (Signed)
Physician Discharge Summary  Patient ID: Stephanie Spears MRN: [id number] DOB/AGE: [birth year] 42 y.o.  Admit date: 10/11/2012 Discharge date: 10/13/2012  Admission Diagnoses:cervical stenosis  Discharge Diagnoses:same  Active Problems:   * No active hospital problems. *   Discharged Condition: no pain  Hospital Course: surgery Consults: none  Significant Diagnostic Studies: mri Treatments: cervical fusion [id number]  Discharge Exam: Blood pressure 122/81, pulse 80, temperature 97.7 F (36.5 C), temperature source Oral, resp. rate 18, height 4\' 11"  (1.499 m), weight 81.2 kg (179 lb 0.2 oz), SpO2 95.00%. No pain  Disposition: home     Medication List    ASK your doctor about these medications       ADVAIR DISKUS 250-50 MCG/DOSE Aepb  Generic drug:  Fluticasone-Salmeterol  Inhale 1 puff into the lungs every 12 (twelve) hours.     albuterol 108 (90 BASE) MCG/ACT inhaler  Commonly known as:  PROVENTIL HFA;VENTOLIN HFA  Inhale 2 puffs into the lungs every 6 (six) hours as needed for wheezing.     diazepam 5 MG tablet  Commonly known as:  VALIUM  Take 5 mg by mouth every 6 (six) hours as needed for anxiety.     gabapentin 300 MG capsule  Commonly known as:  NEURONTIN  Take 300 mg by mouth 3 (three) times daily.     hydrOXYzine 50 MG tablet  Commonly known as:  ATARAX/VISTARIL  Take 50 mg by mouth 3 (three) times daily as needed for itching.     ibuprofen 800 MG tablet  Commonly known as:  ADVIL,MOTRIN  Take 800 mg by mouth every 8 (eight) hours as needed for pain.     lactulose 10 GM/15ML solution  Commonly known as:  CHRONULAC  Take 20 g by mouth 3 (three) times daily.     sertraline 100 MG tablet  Commonly known as:  ZOLOFT  Take 200 mg by mouth daily.     traZODone 100 MG tablet  Commonly known as:  DESYREL  Take 200 mg by mouth at bedtime.         Signed: Karn Cassis 10/13/2012, 8:30 AM

## 2012-10-13 NOTE — Progress Notes (Signed)
After visit summary reviewed with patient no questions at this time. Patient to be discharged home. Prescriptions and incision care discussed with patient by MD.

## 2012-10-13 NOTE — Progress Notes (Signed)
PT Note  Pt up in room ambulating with difficulty.  Discussed with pt that previous PT wanted one more session regarding bed mobility and pt states she is no longer having issues with getting in/out of bed. Pt feels ready for DC and has no further questions for PT.   [address] [place],   [phone] 10/13/2012

## 2012-10-13 NOTE — Plan of Care (Signed)
Problem: Acute Rehab PT Goals Goal: Pt Will Go Supine/Side To Sit Outcome: Completed/Met Date Met:  10/13/12 Per pt report Goal: Pt Will Go Sit To Supine/Side Outcome: Completed/Met Date Met:  10/13/12 Per pt report Goal: Pt Will Ambulate Pt reports no difficulty with ambulation and is ambulating in room when PT entered

## 2012-10-15 NOTE — Progress Notes (Signed)
UR COMPLETED  

## 2012-10-16 ENCOUNTER — Encounter (HOSPITAL_COMMUNITY): Payer: Self-pay | Admitting: Neurosurgery

## 2013-09-07 ENCOUNTER — Emergency Department: Payer: Self-pay | Admitting: Emergency Medicine

## 2013-11-28 IMAGING — DX DG CERVICAL SPINE 1V
1 series · 1 of 1 positions shown · non-contrast
Comparison: MRI cervical spine dated 10/01/2012

CLINICAL DATA: C5-7 ACDF

DG CERVICAL SPINE - 1 VIEW

[lat]
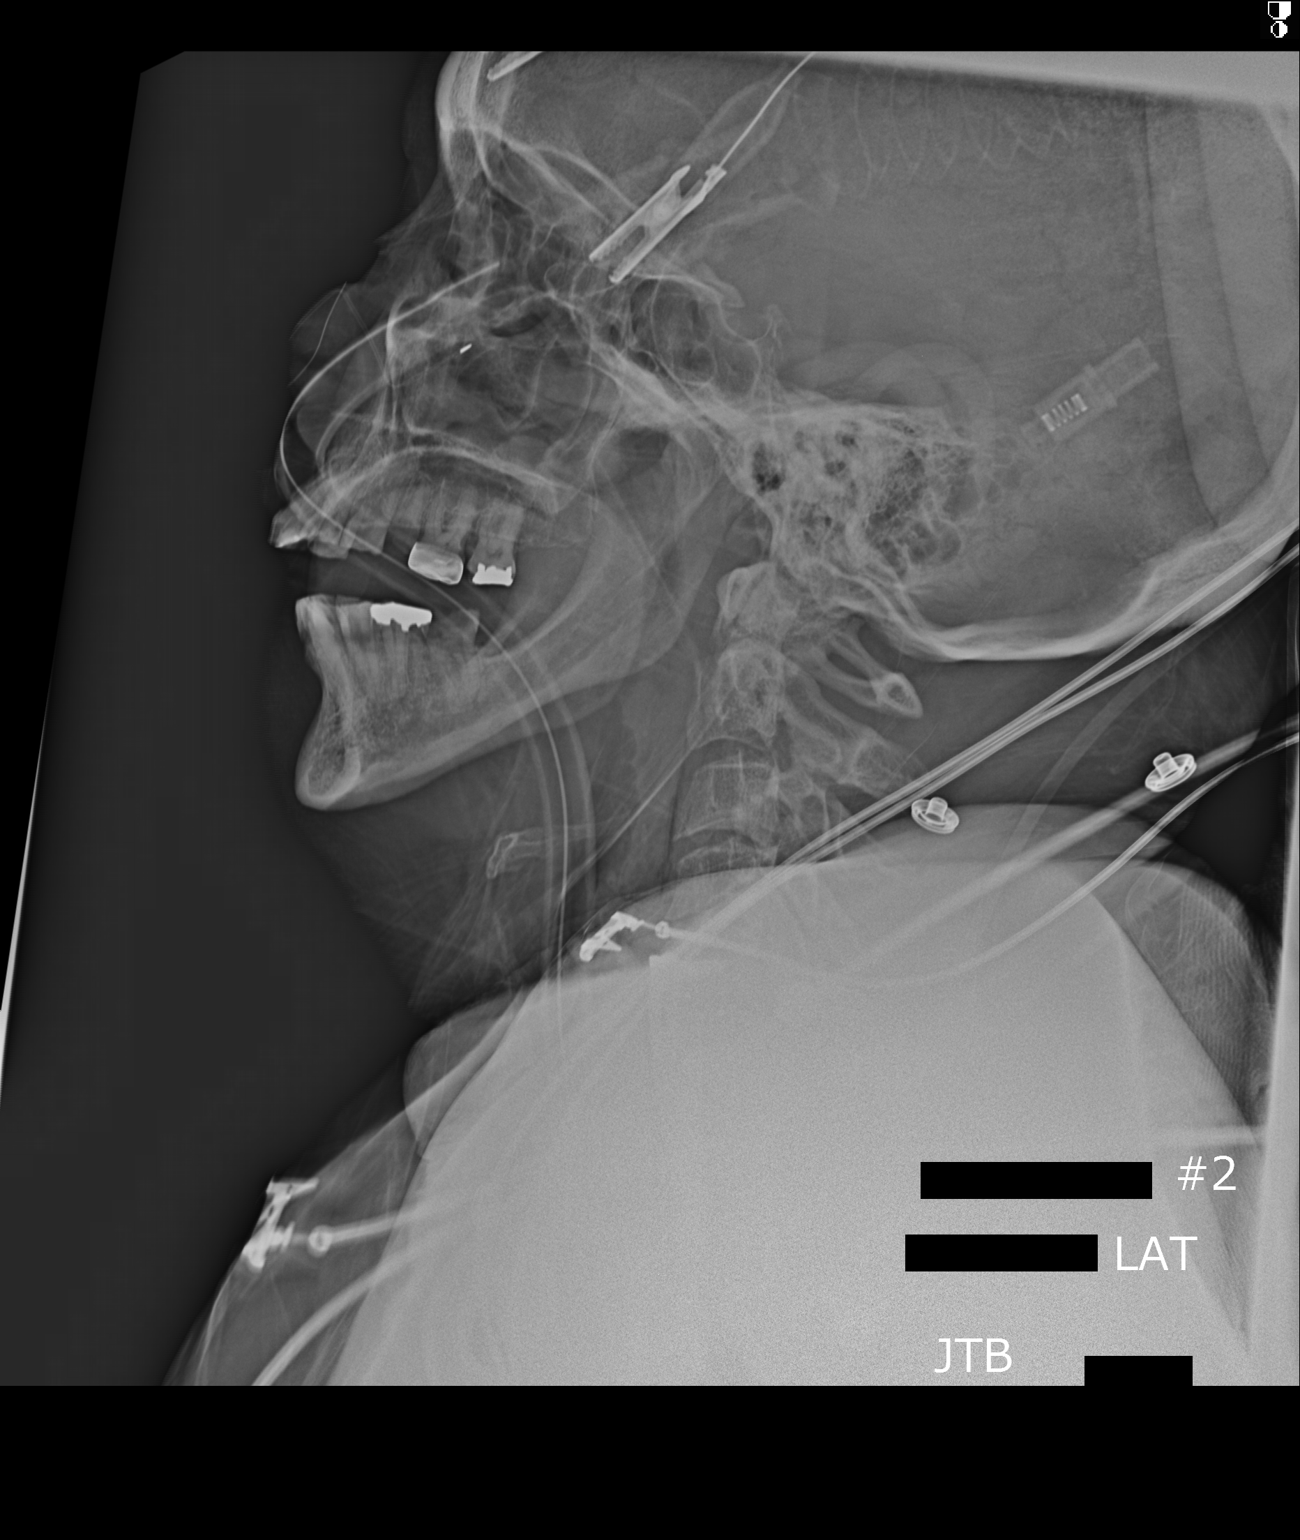

[1 of 1 positions shown; findings below may reference images not displayed]

FINDINGS: Single lateral view demonstrates surgical hardware at C5-
6 from C5-7 ACDF.

The C6-7 level is not well visualized.
IMPRESSION: Surgical hardware at C5-6.  The C6-7 level is not well visualized.

## 2013-11-28 IMAGING — DX DG SPINE 1V PORT
1 series · 1 of 1 positions shown · non-contrast
Comparison: [HOSPITAL] MRI 10/01/2012

CLINICAL DATA: ACDF.  Localization.

PORTABLE SPINE - 1 VIEW

[lat]
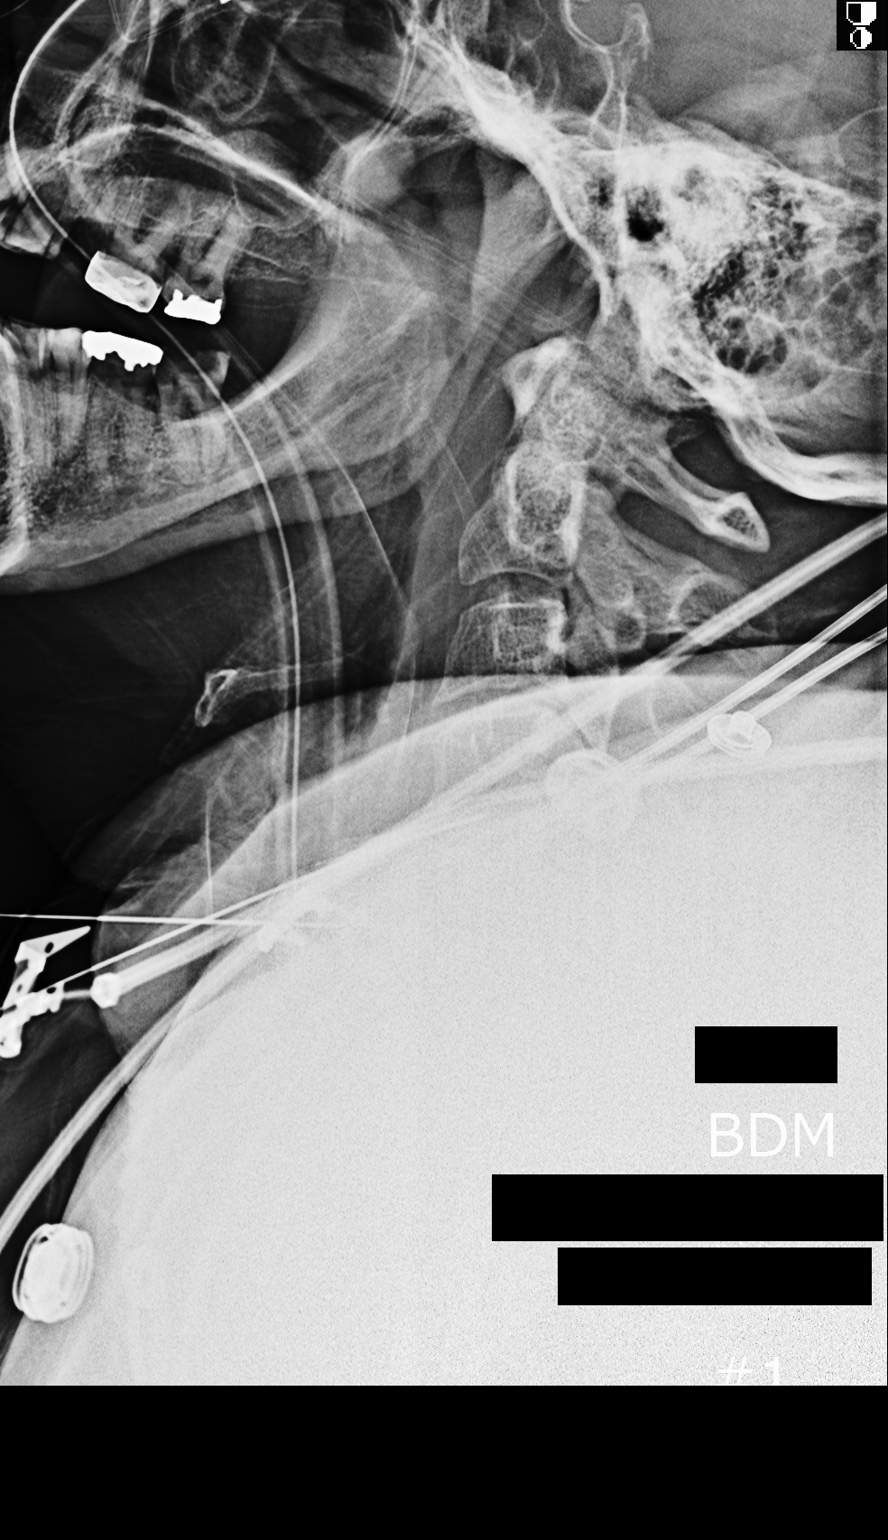

[1 of 1 positions shown; findings below may reference images not displayed]

FINDINGS: Two needles are in place.  There is a needle angled up to
the C4-5 disc level.  There is an angle projecting straight at the
expected location of the C5-6 disc.  Shoulder density makes
visualization of the spine and needle tip at that level and
possible.
IMPRESSION: Needle localization of the C4-5 disc level.  Second needle
projected at the expected location of the C5-6 disc space.

## 2014-01-29 ENCOUNTER — Ambulatory Visit: Payer: Self-pay | Admitting: Orthopedic Surgery

## 2014-08-01 NOTE — Op Note (Signed)
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   OPERATION PERFORMED:  TVT using [employer] exact.  ANESTHESIA USED:  General LMA.  PREOPERATIVE ANTIBIOTICS:  600 grams of clindamycin and 1.5 mg/kg of gentamicin.  PRIMARY SURGEON:  Florina Oundreas M. Bonney AidStaebler, MD.   ESTIMATED BLOOD LOSS:  25 mL.  OPERATIVE FLUIDS:  1200 mL of crystalloid.   COMPLICATIONS:  None.   FINDINGS:  Following placement of the trocar, cystoscopy was performed revealing no perforation to the bladder or urethral injuries.   SPECIMENS REMOVED:  None.   DRAINS OR TUBES:  Foley to gravity drainage.   IMPLANTS:  None.   THE PATIENT CONDITION FOLLOWING PROCEDURE:  Stable.   PROCEDURE IN DETAIL:  The risks, benefits and alternatives of the procedure were discussed with the patient prior to proceeding to the Operating Room. She was taken to the Operating Room where she was placed under general endotracheal anesthesia using an LMA airway. The patient was positioned in the dorsal lithotomy position using Allen stirrups and prepped and draped in the usual sterile fashion. A time-out procedure was performed. Attention was then turned to the patient's pelvis. An 18-French Foley catheter was placed. Following placement of the Foley catheter, the urethra was grasped 1 cm cephalad of the urethral meatus. A 1.5 to 2 cm incision was then made on the anterior vaginal wall. Prior to making the incision, the vaginal foci were injected with lidocaine with epinephrine. Following the incision in the midline, the vaginal mucosa dissected off the urethra. The space in the lateral foci was developed digitally. Following this, the catheter guide was placed through the 8-French Foley retracting the bladder  inferiorly and to the patient's left. The right trocar was passed hugging the pubic symphysis and through the anterior abdominal wall approximately 2 cm lateral and 1 cm superiorly of the pubic symphysis. The same procedure was then repeated to anchor the left arm of the sling. Following placement of both sling arms, the cystoscopy was performed. The trocars were noted to be well away from the bladder without perforation of the bladder or urethra. The protective cover of the mesh was removed and tension adjusted to loosely fit around the urethra, easily allowing a right angle hemostat. Following this, the vaginal mucosa was repaired using a mattress stitch of 2-0 Vicryl. The incision was noted to be hemostatic following the procedure. The sponge, needle, and instrument counts were correct x 2. The patient tolerated the procedure well and was taken to the Recovery Room in stable condition.  ____________________________ Florina OuAndreas M. Bonney AidStaebler, MD ams:jm D: 09/20/2012 12:18:36 ET T: 09/20/2012 12:44:20 ET JOB#: [id number]  cc: Florina OuAndreas M. Bonney AidStaebler, MD, <Dictator> Carmel SacramentoANDREAS Cathrine MusterM Coleby Yett MD ELECTRONICALLY SIGNED 09/25/2012 9:30

## 2014-08-25 DIAGNOSIS — G4700 Insomnia, unspecified: Secondary | ICD-10-CM | POA: Insufficient documentation

## 2014-08-25 DIAGNOSIS — R030 Elevated blood-pressure reading, without diagnosis of hypertension: Secondary | ICD-10-CM

## 2014-08-25 DIAGNOSIS — F411 Generalized anxiety disorder: Secondary | ICD-10-CM | POA: Insufficient documentation

## 2014-08-25 DIAGNOSIS — L300 Nummular dermatitis: Secondary | ICD-10-CM | POA: Insufficient documentation

## 2014-08-25 DIAGNOSIS — R6889 Other general symptoms and signs: Secondary | ICD-10-CM | POA: Insufficient documentation

## 2014-08-25 DIAGNOSIS — Z981 Arthrodesis status: Secondary | ICD-10-CM | POA: Insufficient documentation

## 2014-08-25 DIAGNOSIS — F330 Major depressive disorder, recurrent, mild: Secondary | ICD-10-CM | POA: Insufficient documentation

## 2014-08-25 DIAGNOSIS — IMO0001 Reserved for inherently not codable concepts without codable children: Secondary | ICD-10-CM | POA: Insufficient documentation

## 2014-08-25 DIAGNOSIS — G5600 Carpal tunnel syndrome, unspecified upper limb: Secondary | ICD-10-CM | POA: Insufficient documentation

## 2014-08-25 DIAGNOSIS — M549 Dorsalgia, unspecified: Secondary | ICD-10-CM | POA: Insufficient documentation

## 2014-08-25 DIAGNOSIS — G43909 Migraine, unspecified, not intractable, without status migrainosus: Secondary | ICD-10-CM | POA: Insufficient documentation

## 2014-09-05 ENCOUNTER — Emergency Department
Admission: EM | Admit: 2014-09-05 | Discharge: 2014-09-05 | Disposition: A | Discharge status: Home / Self Care | Payer: BLUE CROSS/BLUE SHIELD | Attending: Emergency Medicine | Admitting: Emergency Medicine

## 2014-09-05 ENCOUNTER — Emergency Department: Payer: BLUE CROSS/BLUE SHIELD

## 2014-09-05 ENCOUNTER — Encounter: Payer: Self-pay | Admitting: General Practice

## 2014-09-05 DIAGNOSIS — Y998 Other external cause status: Secondary | ICD-10-CM | POA: Diagnosis not present

## 2014-09-05 DIAGNOSIS — Y92410 Unspecified street and highway as the place of occurrence of the external cause: Secondary | ICD-10-CM | POA: Insufficient documentation

## 2014-09-05 DIAGNOSIS — S8002XA Contusion of left knee, initial encounter: Secondary | ICD-10-CM | POA: Diagnosis not present

## 2014-09-05 DIAGNOSIS — S199XXA Unspecified injury of neck, initial encounter: Secondary | ICD-10-CM | POA: Diagnosis present

## 2014-09-05 DIAGNOSIS — Z79899 Other long term (current) drug therapy: Secondary | ICD-10-CM | POA: Diagnosis not present

## 2014-09-05 DIAGNOSIS — Y9389 Activity, other specified: Secondary | ICD-10-CM | POA: Diagnosis not present

## 2014-09-05 DIAGNOSIS — Z880 Allergy status to penicillin: Secondary | ICD-10-CM | POA: Diagnosis not present

## 2014-09-05 DIAGNOSIS — S161XXA Strain of muscle, fascia and tendon at neck level, initial encounter: Secondary | ICD-10-CM | POA: Diagnosis not present

## 2014-09-05 MED ORDER — CYCLOBENZAPRINE HCL 10 MG PO TABS: 10.0000 mg | ORAL_TABLET | Freq: Once | ORAL | Status: AC

## 2014-09-05 MED ORDER — CYCLOBENZAPRINE HCL 10 MG PO TABS: 10.0000 mg | ORAL_TABLET | Freq: Three times a day (TID) | ORAL | Status: DC | PRN

## 2014-09-05 MED ORDER — IBUPROFEN 800 MG PO TABS
ORAL_TABLET | ORAL | Status: AC
  Administered 2014-09-05: 800 mg via ORAL
  Filled 2014-09-05: qty 1

## 2014-09-05 MED ORDER — CYCLOBENZAPRINE HCL 10 MG PO TABS
ORAL_TABLET | ORAL | Status: AC
  Administered 2014-09-05: 10 mg via ORAL
  Filled 2014-09-05: qty 1

## 2014-09-05 MED ORDER — IBUPROFEN 800 MG PO TABS: 800.0000 mg | ORAL_TABLET | Freq: Once | ORAL | Status: AC

## 2014-09-05 MED ORDER — IBUPROFEN 800 MG PO TABS: 800.0000 mg | ORAL_TABLET | Freq: Three times a day (TID) | ORAL | Status: DC | PRN
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
## 2014-09-05 NOTE — Discharge Instructions (Signed)
Take medication as prescribed. Rest. Alternate ice and heat for comfort. Avoid strenuous activity.  Follow-up with her primary care physician or the above As needed for continued pain.  Return to the ER for new or worsening concerns.  Cervical Strain and Sprain  with Rehab Cervical strain and sprain are injuries that commonly occur with "whiplash" injuries. Whiplash occurs when the neck is forcefully whipped backward or forward, such as during a motor vehicle accident or during contact sports. The muscles, ligaments, tendons, discs, and nerves of the neck are susceptible to injury when this occurs. RISK FACTORS Risk of having a whiplash injury increases if:  Osteoarthritis of the spine.  Situations that make head or neck accidents or trauma more likely.  High-risk sports (football, rugby, wrestling, hockey, auto racing, gymnastics, diving, contact karate, or boxing).  Poor strength and flexibility of the neck.  Previous neck injury.  Poor tackling technique.  Improperly fitted or padded equipment. SYMPTOMS   Pain or stiffness in the front or back of neck or both.  Symptoms may present immediately or up to 24 hours after injury.  Dizziness, headache, nausea, and vomiting.  Muscle spasm with soreness and stiffness in the neck.  Tenderness and swelling at the injury site. PREVENTION  Learn and use proper technique (avoid tackling with the head, spearing, and head-butting; use proper falling techniques to avoid landing on the head).  Warm up and stretch properly before activity.  Maintain physical fitness:  Strength, flexibility, and endurance.  Cardiovascular fitness.  Wear properly fitted and padded protective equipment, such as padded soft collars, for participation in contact sports. PROGNOSIS  Recovery from cervical strain and sprain injuries is dependent on the extent of the injury. These injuries are usually curable in 1 week to 3 months with appropriate  treatment.  RELATED COMPLICATIONS   Temporary numbness and weakness may occur if the nerve roots are damaged, and this may persist until the nerve has completely healed.  Chronic pain due to frequent recurrence of symptoms.  Prolonged healing, especially if activity is resumed too soon (before complete recovery). TREATMENT  Treatment initially involves the use of ice and medication to help reduce pain and inflammation. It is also important to perform strengthening and stretching exercises and modify activities that worsen symptoms so the injury does not get worse. These exercises may be performed at home or with a therapist. For patients who experience severe symptoms, a soft, padded collar may be recommended to be worn around the neck.  Improving your posture may help reduce symptoms. Posture improvement includes pulling your chin and abdomen in while sitting or standing. If you are sitting, sit in a firm chair with your buttocks against the back of the chair. While sleeping, try replacing your pillow with a small towel rolled to 2 inches in diameter, or use a cervical pillow or soft cervical collar. Poor sleeping positions delay healing.  For patients with nerve root damage, which causes numbness or weakness, the use of a cervical traction apparatus may be recommended. Surgery is rarely necessary for these injuries. However, cervical strain and sprains that are present at birth (congenital) may require surgery. MEDICATION   If pain medication is necessary, nonsteroidal anti-inflammatory medications, such as aspirin and ibuprofen, or other minor pain relievers, such as acetaminophen, are often recommended.  Do not take pain medication for 7 days before surgery.  Prescription pain relievers may be given if deemed necessary by your caregiver. Use only as directed and only as much as you  need. HEAT AND COLD:   Cold treatment (icing) relieves pain and reduces inflammation. Cold treatment should be  applied for 10 to 15 minutes every 2 to 3 hours for inflammation and pain and immediately after any activity that aggravates your symptoms. Use ice packs or an ice massage.  Heat treatment may be used prior to performing the stretching and strengthening activities prescribed by your caregiver, physical therapist, or athletic trainer. Use a heat pack or a warm soak. SEEK MEDICAL CARE IF:   Symptoms get worse or do not improve in 2 weeks despite treatment.  New, unexplained symptoms develop (drugs used in treatment may produce side effects). EXERCISES RANGE OF MOTION (ROM) AND STRETCHING EXERCISES - Cervical Strain and Sprain These exercises may help you when beginning to rehabilitate your injury. In order to successfully resolve your symptoms, you must improve your posture. These exercises are designed to help reduce the forward-head and rounded-shoulder posture which contributes to this condition. Your symptoms may resolve with or without further involvement from your physician, physical therapist or athletic trainer. While completing these exercises, remember:   Restoring tissue flexibility helps normal motion to return to the joints. This allows healthier, less painful movement and activity.  An effective stretch should be held for at least 20 seconds, although you may need to begin with shorter hold times for comfort.  A stretch should never be painful. You should only feel a gentle lengthening or release in the stretched tissue. STRETCH- Axial Extensors  Lie on your back on the floor. You may bend your knees for comfort. Place a rolled-up hand towel or dish towel, about 2 inches in diameter, under the part of your head that makes contact with the floor.  Gently tuck your chin, as if trying to make a "double chin," until you feel a gentle stretch at the base of your head.  Hold __________ seconds. Repeat __________ times. Complete this exercise __________ times per day.  STRETCH - Axial  Extension   Stand or sit on a firm surface. Assume a good posture: chest up, shoulders drawn back, abdominal muscles slightly tense, knees unlocked (if standing) and feet hip width apart.  Slowly retract your chin so your head slides back and your chin slightly lowers. Continue to look straight ahead.  You should feel a gentle stretch in the back of your head. Be certain not to feel an aggressive stretch since this can cause headaches later.  Hold for __________ seconds. Repeat __________ times. Complete this exercise __________ times per day. STRETCH - Cervical Side Bend   Stand or sit on a firm surface. Assume a good posture: chest up, shoulders drawn back, abdominal muscles slightly tense, knees unlocked (if standing) and feet hip width apart.  Without letting your nose or shoulders move, slowly tip your right / left ear to your shoulder until your feel a gentle stretch in the muscles on the opposite side of your neck.  Hold __________ seconds. Repeat __________ times. Complete this exercise __________ times per day. STRETCH - Cervical Rotators   Stand or sit on a firm surface. Assume a good posture: chest up, shoulders drawn back, abdominal muscles slightly tense, knees unlocked (if standing) and feet hip width apart.  Keeping your eyes level with the ground, slowly turn your head until you feel a gentle stretch along the back and opposite side of your neck.  Hold __________ seconds. Repeat __________ times. Complete this exercise __________ times per day. RANGE OF MOTION - Neck Circles  Stand or sit on a firm surface. Assume a good posture: chest up, shoulders drawn back, abdominal muscles slightly tense, knees unlocked (if standing) and feet hip width apart.  Gently roll your head down and around from the back of one shoulder to the back of the other. The motion should never be forced or painful.  Repeat the motion 10-20 times, or until you feel the neck muscles relax and  loosen. Repeat __________ times. Complete the exercise __________ times per day. STRENGTHENING EXERCISES - Cervical Strain and Sprain These exercises may help you when beginning to rehabilitate your injury. They may resolve your symptoms with or without further involvement from your physician, physical therapist, or athletic trainer. While completing these exercises, remember:   Muscles can gain both the endurance and the strength needed for everyday activities through controlled exercises.  Complete these exercises as instructed by your physician, physical therapist, or athletic trainer. Progress the resistance and repetitions only as guided.  You may experience muscle soreness or fatigue, but the pain or discomfort you are trying to eliminate should never worsen during these exercises. If this pain does worsen, stop and make certain you are following the directions exactly. If the pain is still present after adjustments, discontinue the exercise until you can discuss the trouble with your clinician. STRENGTH - Cervical Flexors, Isometric  Face a wall, standing about 6 inches away. Place a small pillow, a ball about 6-8 inches in diameter, or a folded towel between your forehead and the wall.  Slightly tuck your chin and gently push your forehead into the soft object. Push only with mild to moderate intensity, building up tension gradually. Keep your jaw and forehead relaxed.  Hold 10 to 20 seconds. Keep your breathing relaxed.  Release the tension slowly. Relax your neck muscles completely before you start the next repetition. Repeat __________ times. Complete this exercise __________ times per day. STRENGTH- Cervical Lateral Flexors, Isometric   Stand about 6 inches away from a wall. Place a small pillow, a ball about 6-8 inches in diameter, or a folded towel between the side of your head and the wall.  Slightly tuck your chin and gently tilt your head into the soft object. Push only with  mild to moderate intensity, building up tension gradually. Keep your jaw and forehead relaxed.  Hold 10 to 20 seconds. Keep your breathing relaxed.  Release the tension slowly. Relax your neck muscles completely before you start the next repetition. Repeat __________ times. Complete this exercise __________ times per day. STRENGTH - Cervical Extensors, Isometric   Stand about 6 inches away from a wall. Place a small pillow, a ball about 6-8 inches in diameter, or a folded towel between the back of your head and the wall.  Slightly tuck your chin and gently tilt your head back into the soft object. Push only with mild to moderate intensity, building up tension gradually. Keep your jaw and forehead relaxed.  Hold 10 to 20 seconds. Keep your breathing relaxed.  Release the tension slowly. Relax your neck muscles completely before you start the next repetition. Repeat __________ times. Complete this exercise __________ times per day. POSTURE AND BODY MECHANICS CONSIDERATIONS - Cervical Strain and Sprain Keeping correct posture when sitting, standing or completing your activities will reduce the stress put on different body tissues, allowing injured tissues a chance to heal and limiting painful experiences. The following are general guidelines for improved posture. Your physician or physical therapist will provide you with any instructions specific  to your needs. While reading these guidelines, remember:  The exercises prescribed by your provider will help you have the flexibility and strength to maintain correct postures.  The correct posture provides the optimal environment for your joints to work. All of your joints have less wear and tear when properly supported by a spine with good posture. This means you will experience a healthier, less painful body.  Correct posture must be practiced with all of your activities, especially prolonged sitting and standing. Correct posture is as important when  doing repetitive low-stress activities (typing) as it is when doing a single heavy-load activity (lifting). PROLONGED STANDING WHILE SLIGHTLY LEANING FORWARD When completing a task that requires you to lean forward while standing in one place for a long time, place either foot up on a stationary 2- to 4-inch high object to help maintain the best posture. When both feet are on the ground, the low back tends to lose its slight inward curve. If this curve flattens (or becomes too large), then the back and your other joints will experience too much stress, fatigue more quickly, and can cause pain.  RESTING POSITIONS Consider which positions are most painful for you when choosing a resting position. If you have pain with flexion-based activities (sitting, bending, stooping, squatting), choose a position that allows you to rest in a less flexed posture. You would want to avoid curling into a fetal position on your side. If your pain worsens with extension-based activities (prolonged standing, working overhead), avoid resting in an extended position such as sleeping on your stomach. Most people will find more comfort when they rest with their spine in a more neutral position, neither too rounded nor too arched. Lying on a non-sagging bed on your side with a pillow between your knees, or on your back with a pillow under your knees will often provide some relief. Keep in mind, being in any one position for a prolonged period of time, no matter how correct your posture, can still lead to stiffness. WALKING Walk with an upright posture. Your ears, shoulders, and hips should all line up. OFFICE WORK When working at a desk, create an environment that supports good, upright posture. Without extra support, muscles fatigue and lead to excessive strain on joints and other tissues. CHAIR:  A chair should be able to slide under your desk when your back makes contact with the back of the chair. This allows you to work  closely.  The chair's height should allow your eyes to be level with the upper part of your monitor and your hands to be slightly lower than your elbows.  Body position:  Your feet should make contact with the floor. If this is not possible, use a foot rest.  Keep your ears over your shoulders. This will reduce stress on your neck and low back. Document Released: 03/28/2005 Document Revised: 08/12/2013 Document Reviewed: 07/10/2008 [facility] Patient Information 2015 [place], [place]. This information is not intended to replace advice given to you by your health care provider. Make sure you discuss any questions you have with your health care provider.

## 2014-09-15 ENCOUNTER — Other Ambulatory Visit: Payer: Self-pay

## 2014-09-15 ENCOUNTER — Ambulatory Visit (INDEPENDENT_AMBULATORY_CARE_PROVIDER_SITE_OTHER): Payer: BLUE CROSS/BLUE SHIELD | Admitting: Family Medicine

## 2014-09-15 ENCOUNTER — Encounter: Payer: Self-pay | Admitting: Family Medicine

## 2014-09-15 VITALS — BP 122/84 | HR 80 | Temp 97.8°F | Resp 16 | Wt 183.2 lb

## 2014-09-15 DIAGNOSIS — M25512 Pain in left shoulder: Secondary | ICD-10-CM | POA: Diagnosis not present

## 2014-09-15 DIAGNOSIS — F419 Anxiety disorder, unspecified: Secondary | ICD-10-CM | POA: Diagnosis not present

## 2014-09-15 MED ORDER — DIAZEPAM 5 MG PO TABS: 5.0000 mg | ORAL_TABLET | Freq: Four times a day (QID) | ORAL | Status: DC | PRN

## 2014-09-15 MED ORDER — ALBUTEROL SULFATE HFA 108 (90 BASE) MCG/ACT IN AERS: 2.0000 | INHALATION_SPRAY | Freq: Four times a day (QID) | RESPIRATORY_TRACT | Status: DC | PRN
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
## 2014-09-16 ENCOUNTER — Other Ambulatory Visit: Payer: Self-pay | Admitting: Orthopedic Surgery

## 2014-09-16 DIAGNOSIS — M25512 Pain in left shoulder: Secondary | ICD-10-CM

## 2014-09-23 ENCOUNTER — Ambulatory Visit
Admission: RE | Admit: 2014-09-23 | Discharge: 2014-09-23 | Disposition: A | Discharge status: Home / Self Care | Payer: BLUE CROSS/BLUE SHIELD | Source: Ambulatory Visit | Attending: Orthopedic Surgery | Admitting: Orthopedic Surgery

## 2014-09-23 DIAGNOSIS — M7552 Bursitis of left shoulder: Secondary | ICD-10-CM | POA: Insufficient documentation

## 2014-09-23 DIAGNOSIS — M25512 Pain in left shoulder: Secondary | ICD-10-CM

## 2014-09-23 DIAGNOSIS — M25511 Pain in right shoulder: Secondary | ICD-10-CM | POA: Diagnosis present

## 2014-09-23 MED ORDER — GADOBENATE DIMEGLUMINE 529 MG/ML IV SOLN: 5.0000 mL | Freq: Once | INTRAVENOUS | Status: AC | PRN

## 2014-09-23 MED ORDER — IOHEXOL 300 MG/ML  SOLN: 10.0000 mL | Freq: Once | INTRAMUSCULAR | Status: AC | PRN

## 2015-01-13 ENCOUNTER — Telehealth: Payer: Self-pay | Admitting: Family Medicine

## 2015-01-13 NOTE — Telephone Encounter (Signed)
Please advise 

## 2015-01-13 NOTE — Telephone Encounter (Signed)
Pt is requesting her daughter, Stephanie Spears 06/30/1999 establish with you.  She has Medicaid.  No Rx.  Mom is not sure where she was seen before.  Mom states she adopted [place] in March and not sure where she was seen before.  CB#[phone]/MW

## 2015-01-22 NOTE — Telephone Encounter (Signed)
Contacted Stephanie MessierKathy and scheduled [facility] [place] for a new pt appt with Stephanie Spears on Stephanie Spears. The new pt is Stephanie Spears. Thanks TNP

## 2015-05-07 ENCOUNTER — Telehealth: Payer: Self-pay | Admitting: Family Medicine

## 2015-05-07 NOTE — Telephone Encounter (Signed)
Pt called and needs refill on these RX's  hydrOXYzine (ATARAX/VISTARIL) 50 MG tablet  These I do not see.  Trazadone  Fluid pill.  [facility] [place]  Pt's call back is [phone]  Thanks Barth Kirks

## 2015-05-08 ENCOUNTER — Other Ambulatory Visit: Payer: Self-pay | Admitting: Family Medicine

## 2015-05-08 DIAGNOSIS — L300 Nummular dermatitis: Secondary | ICD-10-CM

## 2015-05-08 DIAGNOSIS — G4700 Insomnia, unspecified: Secondary | ICD-10-CM

## 2015-05-08 MED ORDER — TRAZODONE HCL 100 MG PO TABS: 200.0000 mg | ORAL_TABLET | Freq: Every day | ORAL | Status: DC

## 2015-05-08 MED ORDER — HYDROXYZINE HCL 50 MG PO TABS: 50.0000 mg | ORAL_TABLET | Freq: Three times a day (TID) | ORAL | Status: DC | PRN

## 2015-05-08 NOTE — Telephone Encounter (Signed)
Pt informed. She has not seeing psychiatrist so these are not being prescribed there. She reports that the hydroxyzine is the man on that she needs right now because she uses this for itching related to anxiety and she is currently itching bad. I advised her that she needed to come in and she said that she would not have time to com in until next week and by then she will have a skin infection because she has done this in the past. Please advise.

## 2015-05-08 NOTE — Telephone Encounter (Signed)
Sent refill of Hydroxyzine and Trazodone to the pharmacy to cover her until she can come in for follow up appointment.

## 2015-05-08 NOTE — Telephone Encounter (Signed)
Chart indicates Trazodone was discontinued in June 2016 and have not prescribed a fluid pill since 2011. Will need labs and follow up appointment unless psychiatrist is still prescribing these.

## 2015-05-11 ENCOUNTER — Other Ambulatory Visit: Payer: Self-pay | Admitting: Family Medicine

## 2015-05-11 NOTE — Telephone Encounter (Signed)
[facility] [place]

## 2015-05-12 MED ORDER — FLUTICASONE-SALMETEROL 250-50 MCG/DOSE IN AEPB: 1.0000 | INHALATION_SPRAY | Freq: Two times a day (BID) | RESPIRATORY_TRACT | Status: DC

## 2015-05-12 NOTE — Telephone Encounter (Signed)
Patient advised RX has been sent to pharmacy  

## 2015-05-12 NOTE — Telephone Encounter (Signed)
Refill of Advair sent to [facility].

## 2015-05-15 ENCOUNTER — Other Ambulatory Visit: Payer: Self-pay

## 2015-05-15 MED ORDER — FUROSEMIDE 20 MG PO TABS: 20.0000 mg | ORAL_TABLET | Freq: Every day | ORAL | Status: DC

## 2015-05-15 NOTE — Telephone Encounter (Signed)
Patient advised as directed below. Patient scheduled follow up appointment for Friday 05/22/2015.

## 2015-05-15 NOTE — Telephone Encounter (Signed)
Refill request received from [facility] requesting Furosemide 20 mg- Take 1 tablet by mouth once daily if needed for edema.

## 2015-05-15 NOTE — Telephone Encounter (Signed)
Will send small supply to the pharmacy for edema. Must see the swelling and response of BP to use of Furosemide next week.

## 2015-05-22 ENCOUNTER — Ambulatory Visit (INDEPENDENT_AMBULATORY_CARE_PROVIDER_SITE_OTHER): Payer: BLUE CROSS/BLUE SHIELD | Admitting: Family Medicine

## 2015-05-22 ENCOUNTER — Encounter: Payer: Self-pay | Admitting: Family Medicine

## 2015-05-22 VITALS — BP 110/82 | HR 82 | Temp 98.2°F | Resp 14 | Ht 59.0 in | Wt 190.0 lb

## 2015-05-22 DIAGNOSIS — F411 Generalized anxiety disorder: Secondary | ICD-10-CM | POA: Diagnosis not present

## 2015-05-22 DIAGNOSIS — E663 Overweight: Secondary | ICD-10-CM

## 2015-05-22 DIAGNOSIS — J4530 Mild persistent asthma, uncomplicated: Secondary | ICD-10-CM | POA: Diagnosis not present

## 2015-05-22 MED ORDER — ALBUTEROL SULFATE HFA 108 (90 BASE) MCG/ACT IN AERS: 2.0000 | INHALATION_SPRAY | Freq: Four times a day (QID) | RESPIRATORY_TRACT | Status: DC | PRN

## 2015-05-22 NOTE — Patient Instructions (Signed)
Obesity Obesity is defined as having too much total body fat and a body mass index (BMI) of 30 or more. BMI is an estimate of body fat and is calculated from your height and weight. BMI is typically calculated by your health care provider during regular wellness visits. Obesity happens when you consume more calories than you can burn by exercising or performing daily physical tasks. Prolonged obesity can cause major illnesses or emergencies, such as:  Stroke.  Heart disease.  Diabetes.  Cancer.  Arthritis.  High blood pressure (hypertension).  High cholesterol.  Sleep apnea.  Erectile dysfunction.  Infertility problems. CAUSES   Regularly eating unhealthy foods.  Physical inactivity.  Certain disorders, such as an underactive thyroid (hypothyroidism), Cushing's syndrome, and polycystic ovarian syndrome.  Certain medicines, such as steroids, some depression medicines, and antipsychotics.  Genetics.  Lack of sleep. DIAGNOSIS A health care provider can diagnose obesity after calculating your BMI. Obesity will be diagnosed if your BMI is 30 or higher. There are other methods of measuring obesity levels. Some other methods include measuring your skinfold thickness, your waist circumference, and comparing your hip circumference to your waist circumference. TREATMENT  A healthy treatment program includes some or all of the following:  Long-term dietary changes.  Exercise and physical activity.  Behavioral and lifestyle changes.  Medicine only under the supervision of your health care provider. Medicines may help, but only if they are used with diet and exercise programs. If your BMI is 40 or higher, your health care provider may recommend specialized surgery or programs to help with weight loss. An unhealthy treatment program includes:  Fasting.  Fad diets.  Supplements and drugs. These choices do not succeed in long-term weight control. HOME CARE  INSTRUCTIONS  Exercise and perform physical activity as directed by your health care provider. To increase physical activity, try the following:  Use stairs instead of elevators.  Park farther away from store entrances.  Garden, bike, or walk instead of watching television or using the computer.  Eat healthy, low-calorie foods and drinks on a regular basis. Eat more fruits and vegetables. Use low-calorie cookbooks or take healthy cooking classes.  Limit fast food, sweets, and processed snack foods.  Eat smaller portions.  Keep a daily journal of everything you eat. There are many free websites to help you with this. It may be helpful to measure your foods so you can determine if you are eating the correct portion sizes.  Avoid drinking alcohol. Drink more water and drinks without calories.  Take vitamins and supplements only as recommended by your health care provider.  Weight-loss support groups, registered dietitians, counselors, and stress reduction education can also be very helpful. SEEK IMMEDIATE MEDICAL CARE IF:  You have chest pain or tightness.  You have trouble breathing or feel short of breath.  You have weakness or leg numbness.  You feel confused or have trouble talking.  You have sudden changes in your vision.   This information is not intended to replace advice given to you by your health care provider. Make sure you discuss any questions you have with your health care provider.   Document Released: 05/05/2004 Document Revised: 04/18/2014 Document Reviewed: 05/04/2011 Elsevier Interactive Patient Education 2016 Elsevier Inc.  

## 2015-05-22 NOTE — Progress Notes (Signed)
Patient ID: Stephanie Spears, female   DOB: [birth year], 45 y.o.   MRN: [id number]   Patient: Stephanie Spears Female    DOB: 1971/02/11   45 y.o.   MRN: [id number] Visit Date: 05/22/2015  Today's Provider: Dortha Kern, PA   Chief Complaint  Patient presents with  . Anxiety  . Insomnia  . Follow-up   Subjective:    Anxiety Presents for follow-up visit. Symptoms include insomnia and nervous/anxious behavior.   (Anxiety)  Insomnia Primary symptoms: sleep disturbance.    Patient Active Problem List   Diagnosis Date Noted  . Anxiety, generalized 08/25/2014  . Back pain with radiation 08/25/2014  . Carpal tunnel syndrome 08/25/2014  . Insomnia, persistent 08/25/2014  . Blood pressure elevated 08/25/2014  . Dermatitis, nummular 08/25/2014  . H/O arthrodesis 08/25/2014  . Does not feel right 08/25/2014  . Depression, major, recurrent, mild (HCC) 08/25/2014  . Headache, migraine 08/25/2014  . Ache in joint 11/09/2009  . Menopausal symptom 10/26/2009  . Cannot sleep 09/10/2009  . Excess weight 01/06/2009  . Disease of hair 12/19/2008  . Cephalalgia 12/19/2008  . Carbuncle and furuncle 08/08/2008  . Asthma 08/06/2007  . Open wound of breast 05/31/2007   Past Surgical History  Procedure Laterality Date  . Abdominal hysterectomy    . Incontinence surgery  09-20-2012  . Carpal tunnel release Right 2011  . Anterior cervical decomp/discectomy fusion N/A 10/11/2012    Procedure: CERVICAL FIVE-SIX, CERVICAL SIX-SEVEN ANTERIOR CERVICAL DECOMPRESSION/DISCECTOMY FUSION 2 LEVELS;  Surgeon: Karn Cassis, MD;  Location: [facility];  Service: Neurosurgery;  Laterality: N/A;  C56 C67 anterior cervical decompression with fusion plating and bonegraft   Family History  Problem Relation Age of Onset  . Emphysema Father   . Lung disease Father   . Diabetes Maternal Grandmother   . Stroke Maternal Grandmother   . COPD Maternal Grandfather   . Lung cancer Paternal Grandfather      Previous  Medications   ALBUTEROL (PROVENTIL HFA;VENTOLIN HFA) 108 (90 BASE) MCG/ACT INHALER    Inhale 2 puffs into the lungs every 6 (six) hours as needed for wheezing.   DIAZEPAM (VALIUM) 5 MG TABLET    Take 1 tablet (5 mg total) by mouth every 6 (six) hours as needed for anxiety (and muscle spasm).   FLUTICASONE-SALMETEROL (ADVAIR DISKUS) 250-50 MCG/DOSE AEPB    Inhale 1 puff into the lungs every 12 (twelve) hours.   FUROSEMIDE (LASIX) 20 MG TABLET    Take 1 tablet (20 mg total) by mouth daily.   HYDROXYZINE (ATARAX/VISTARIL) 50 MG TABLET    Take 1 tablet (50 mg total) by mouth 3 (three) times daily as needed for itching.   TRAZODONE (DESYREL) 100 MG TABLET    Take 2 tablets (200 mg total) by mouth at bedtime.   Allergies  Allergen Reactions  . Aspirin Shortness Of Breath  . Penicillins Anaphylaxis    Review of Systems  Constitutional: Negative.   HENT: Negative.   Eyes: Negative.   Respiratory: Negative.   Cardiovascular: Negative.   Gastrointestinal: Negative.   Endocrine: Negative.   Genitourinary: Negative.   Musculoskeletal: Negative.   Skin: Negative.   Allergic/Immunologic: Negative.   Neurological: Negative.   Hematological: Negative.   Psychiatric/Behavioral: Positive for sleep disturbance. The patient is nervous/anxious and has insomnia.     Social History  Substance Use Topics  . Smoking status: Never Smoker   . Smokeless tobacco: Not on file  . Alcohol Use: No   Objective:  BP 110/82 mmHg  Pulse 82  Temp(Src) 98.2 F (36.8 C) (Oral)  Resp 14  Wt 190 lb (86.183 kg)  SpO2 96% Body mass index is 38.35 kg/(m^2).  Wt Readings from Last 3 Encounters:  05/22/15 190 lb (86.183 kg)  09/15/14 183 lb 3.2 oz (83.099 kg)  09/05/14 170 lb (77.111 kg)    Physical Exam  Constitutional: She is oriented to person, place, and time. She appears well-developed and well-nourished. No distress.  HENT:  Head: Normocephalic and atraumatic.  Right Ear: Hearing normal.  Left Ear:  Hearing normal.  Nose: Nose normal.  Eyes: Conjunctivae and lids are normal. Right eye exhibits no discharge. Left eye exhibits no discharge. No scleral icterus.  Neck: Normal range of motion. Neck supple.  Cardiovascular: Normal rate and regular rhythm.   Pulmonary/Chest: Effort normal. No respiratory distress.  Abdominal: Soft. Bowel sounds are normal.  Musculoskeletal: Normal range of motion.  Neurological: She is alert and oriented to person, place, and time.  Skin: Skin is intact. No lesion and no rash noted.  Psychiatric: She has a normal mood and affect. Her speech is normal and behavior is normal. Thought content normal.      Assessment & Plan:     1. Anxiety, generalized Feeling stable and not picking at skin now. Occurs when she has large amount of stress (primarily from her job). Uses Trazodone to help with insomnia (intermittent) and Hydroxyzine for anxiety and itchy skin (intermittently). Continue present regimen and follow up routine labs.  2. Excess weight Worried about increase in weight over the past 6-8 months (gained 7 lbs). Doesn't want to have severe obesity like her father (he is about to have more bariatric surgery). Will check labs for metabolic disorder. Encouraged to exercise and work on diet. Recheck pending lab reports. - CBC with Differential/Platelet - COMPLETE METABOLIC PANEL WITH GFR - Lipid panel - TSH - Hemoglobin A1c  3. Asthma, mild persistent, uncomplicated Stable on the Advair BID regularly. Has some allergic rhinitis and uses Zyrtec prn. Needs refill of rescue Albuterol MDI. Lungs clear to auscultation today. - albuterol (PROVENTIL HFA;VENTOLIN HFA) 108 (90 Base) MCG/ACT inhaler; Inhale 2 puffs into the lungs every 6 (six) hours as needed for wheezing.  Dispense: 1 Inhaler; Refill: 3

## 2015-05-23 LAB — COMPREHENSIVE METABOLIC PANEL
ALT: 17 IU/L (ref 0–32)
AST: 18 IU/L (ref 0–40)
Albumin/Globulin Ratio: 1.7 (ref 1.1–2.5)
Albumin: 4.3 g/dL (ref 3.5–5.5)
Alkaline Phosphatase: 74 IU/L (ref 39–117)
BUN/Creatinine Ratio: 15 (ref 9–23)
BUN: 11 mg/dL (ref 6–24)
Bilirubin Total: 0.3 mg/dL (ref 0.0–1.2)
CO2: 27 mmol/L (ref 18–29)
Calcium: 9.2 mg/dL (ref 8.7–10.2)
Chloride: 101 mmol/L (ref 96–106)
Creatinine, Ser: 0.74 mg/dL (ref 0.57–1.00)
GFR calc Af Amer: 115 mL/min/{1.73_m2} (ref >59)
GFR calc non Af Amer: 100 mL/min/{1.73_m2} (ref >59)
Globulin, Total: 2.6 g/dL (ref 1.5–4.5)
Glucose: 103 mg/dL — ABNORMAL HIGH (ref 65–99)
Potassium: 4.8 mmol/L (ref 3.5–5.2)
Sodium: 143 mmol/L (ref 134–144)
Total Protein: 6.9 g/dL (ref 6.0–8.5)

## 2015-05-23 LAB — CBC WITH DIFFERENTIAL/PLATELET
Basophils Absolute: 0.1 10*3/uL (ref 0.0–0.2)
Basos: 1 %
EOS (ABSOLUTE): 0.3 10*3/uL (ref 0.0–0.4)
Eos: 6 %
Hematocrit: 41.7 % (ref 34.0–46.6)
Hemoglobin: 14.2 g/dL (ref 11.1–15.9)
Immature Grans (Abs): 0 10*3/uL (ref 0.0–0.1)
Immature Granulocytes: 0 %
Lymphocytes Absolute: 2 10*3/uL (ref 0.7–3.1)
Lymphs: 35 %
MCH: 28.1 pg (ref 26.6–33.0)
MCHC: 34.1 g/dL (ref 31.5–35.7)
MCV: 82 fL (ref 79–97)
Monocytes Absolute: 0.4 10*3/uL (ref 0.1–0.9)
Monocytes: 6 %
Neutrophils Absolute: 2.9 10*3/uL (ref 1.4–7.0)
Neutrophils: 52 %
Platelets: 248 10*3/uL (ref 150–379)
RBC: 5.06 x10E6/uL (ref 3.77–5.28)
RDW: 12.9 % (ref 12.3–15.4)
WBC: 5.7 10*3/uL (ref 3.4–10.8)

## 2015-05-23 LAB — HEMOGLOBIN A1C
Est. average glucose Bld gHb Est-mCnc: 114 mg/dL
Hgb A1c MFr Bld: 5.6 % (ref 4.8–5.6)

## 2015-05-23 LAB — LIPID PANEL
Chol/HDL Ratio: 3.7 ratio units (ref 0.0–4.4)
Cholesterol, Total: 189 mg/dL (ref 100–199)
HDL: 51 mg/dL (ref >39)
LDL Calculated: 116 mg/dL — ABNORMAL HIGH (ref 0–99)
Triglycerides: 108 mg/dL (ref 0–149)
VLDL Cholesterol Cal: 22 mg/dL (ref 5–40)

## 2015-05-23 LAB — TSH: TSH: 1.58 u[IU]/mL (ref 0.450–4.500)

## 2015-05-28 ENCOUNTER — Other Ambulatory Visit: Payer: Self-pay | Admitting: Family Medicine

## 2015-05-28 ENCOUNTER — Telehealth: Payer: Self-pay

## 2015-05-28 NOTE — Telephone Encounter (Signed)
-----   Message from Tamsen Roers, [place] sent at 05/28/2015  8:47 AM EST ----- Blood tests are normal and do NOT show signs of diabetes. May start weight loss efforts with using Alli (OTC) with low fat, low calorie diet and regular exercise for 20-30 minutes daily. Recheck progress in 4 weeks.

## 2015-05-28 NOTE — Telephone Encounter (Signed)
LMTCB

## 2015-05-28 NOTE — Telephone Encounter (Signed)
-----   Message from Dennis E Chrismon, PA sent at 05/28/2015  8:47 AM EST ----- Blood tests are normal and do NOT show signs of diabetes. May start weight loss efforts with using Alli (OTC) with low fat, low calorie diet and regular exercise for 20-30 minutes daily. Recheck progress in 4 weeks. 

## 2015-06-01 NOTE — Telephone Encounter (Signed)
Patient advised as directed below. Patient verbalized understanding and agrees with plan of care.  

## 2015-08-17 ENCOUNTER — Other Ambulatory Visit: Payer: Self-pay | Admitting: Family Medicine

## 2015-08-17 MED ORDER — SUMATRIPTAN SUCCINATE 50 MG PO TABS: ORAL_TABLET | ORAL | Status: DC

## 2015-11-20 DIAGNOSIS — F4320 Adjustment disorder, unspecified: Secondary | ICD-10-CM | POA: Diagnosis not present

## 2015-11-26 DIAGNOSIS — F4320 Adjustment disorder, unspecified: Secondary | ICD-10-CM | POA: Diagnosis not present

## 2015-12-04 DIAGNOSIS — F4320 Adjustment disorder, unspecified: Secondary | ICD-10-CM | POA: Diagnosis not present

## 2016-03-07 ENCOUNTER — Other Ambulatory Visit: Payer: Self-pay | Admitting: Family Medicine

## 2016-03-07 MED ORDER — IBUPROFEN 800 MG PO TABS: 800.0000 mg | ORAL_TABLET | Freq: Three times a day (TID) | ORAL | 1 refills | Status: DC | PRN

## 2016-08-02 ENCOUNTER — Encounter: Payer: Self-pay | Admitting: Family Medicine

## 2016-08-02 ENCOUNTER — Ambulatory Visit (INDEPENDENT_AMBULATORY_CARE_PROVIDER_SITE_OTHER): Payer: BLUE CROSS/BLUE SHIELD | Admitting: Family Medicine

## 2016-08-02 VITALS — BP 128/90 | HR 91 | Temp 98.0°F | Wt 190.2 lb

## 2016-08-02 DIAGNOSIS — J0140 Acute pansinusitis, unspecified: Secondary | ICD-10-CM

## 2016-08-02 DIAGNOSIS — J309 Allergic rhinitis, unspecified: Secondary | ICD-10-CM

## 2016-08-02 DIAGNOSIS — J4520 Mild intermittent asthma, uncomplicated: Secondary | ICD-10-CM

## 2016-08-02 MED ORDER — AZITHROMYCIN 250 MG PO TABS
ORAL_TABLET | ORAL | 0 refills | Status: DC
Start: 2016-08-02 — End: 2018-06-28

## 2016-08-02 MED ORDER — PSEUDOEPH-BROMPHEN-DM 30-2-10 MG/5ML PO SYRP: 5.0000 mL | ORAL_SOLUTION | Freq: Four times a day (QID) | ORAL | 0 refills | Status: DC | PRN

## 2016-08-02 NOTE — Progress Notes (Signed)
Patient: Stephanie Spears Female    DOB: March 12, 1971   46 y.o.   MRN: [id number] Visit Date: 08/02/2016  Today's Provider: Dortha Kern, PA   Chief Complaint  Patient presents with  . URI   Subjective:    URI   This is a new problem. Episode onset: 3 days ago. The problem has been gradually worsening. Associated symptoms include congestion, coughing, ear pain, rhinorrhea, sinus pain and a sore throat. Associated symptoms comments: SOB. Treatments tried: NyQuil. The treatment provided no relief.   Past Medical History:  Diagnosis Date  . Anxiety   . Asthma    Past Surgical History:  Procedure Laterality Date  . ABDOMINAL HYSTERECTOMY    . ANTERIOR CERVICAL DECOMP/DISCECTOMY FUSION N/A 10/11/2012   Procedure: CERVICAL FIVE-SIX, CERVICAL SIX-SEVEN ANTERIOR CERVICAL DECOMPRESSION/DISCECTOMY FUSION 2 LEVELS;  Surgeon: Karn Cassis, MD;  Location: [facility];  Service: Neurosurgery;  Laterality: N/A;  C56 C67 anterior cervical decompression with fusion plating and bonegraft  . CARPAL TUNNEL RELEASE Right 2011  . INCONTINENCE SURGERY  09-20-2012   Family History  Problem Relation Age of Onset  . Emphysema Father   . Lung disease Father   . Diabetes Maternal Grandmother   . Stroke Maternal Grandmother   . COPD Maternal Grandfather   . Lung cancer Paternal Grandfather   ' Allergies  Allergen Reactions  . Aspirin Shortness Of Breath  . Penicillins Anaphylaxis     Previous Medications   ALBUTEROL (PROVENTIL HFA;VENTOLIN HFA) 108 (90 BASE) MCG/ACT INHALER    Inhale 2 puffs into the lungs every 6 (six) hours as needed for wheezing.   DIAZEPAM (VALIUM) 5 MG TABLET    Take 1 tablet (5 mg total) by mouth every 6 (six) hours as needed for anxiety (and muscle spasm).   FLUTICASONE-SALMETEROL (ADVAIR DISKUS) 250-50 MCG/DOSE AEPB    Inhale 1 puff into the lungs every 12 (twelve) hours.   FUROSEMIDE (LASIX) 20 MG TABLET    Take 1 tablet (20 mg total) by mouth daily.   HYDROXYZINE  (ATARAX/VISTARIL) 50 MG TABLET    Take 1 tablet (50 mg total) by mouth 3 (three) times daily as needed for itching.   IBUPROFEN (ADVIL,MOTRIN) 800 MG TABLET    Take 1 tablet (800 mg total) by mouth every 8 (eight) hours as needed for mild pain or moderate pain.   SUMATRIPTAN (IMITREX) 50 MG TABLET    May repeat in 2 hours if headache persists or recurs. Limit to 2 doses in any 24 hours.   TRAZODONE (DESYREL) 100 MG TABLET    Take 2 tablets (200 mg total) by mouth at bedtime.    Review of Systems  Constitutional: Negative.   HENT: Positive for congestion, ear pain, rhinorrhea, sinus pain and sore throat.   Respiratory: Positive for cough and shortness of breath.   Cardiovascular: Negative.     Social History  Substance Use Topics  . Smoking status: Never Smoker  . Smokeless tobacco: Never Used  . Alcohol use No   Objective:   BP 128/90 (BP Location: Right Arm, Patient Position: Sitting, Cuff Size: Normal)   Pulse 91   Temp 98 F (36.7 C) (Oral)   Wt 190 lb 3.2 oz (86.3 kg)   SpO2 97%   BMI 38.42 kg/m   Physical Exam  Constitutional: She is oriented to person, place, and time. She appears well-developed and well-nourished. No distress.  HENT:  Head: Normocephalic and atraumatic.  Right Ear: Hearing and external ear normal.  Left Ear: Hearing and external ear normal.  Nose: Nose normal.  Turbinates pale and swollen with clear mucus drainage. Cobblestoning of posterior pharynx. No exudates. Very tender maxillary and frontal sinuses to any palpation.  Eyes: Conjunctivae and lids are normal. Right eye exhibits no discharge. Left eye exhibits no discharge. No scleral icterus.  Neck: Neck supple.  Cardiovascular: Normal rate and regular rhythm.   Pulmonary/Chest: Effort normal and breath sounds normal. No respiratory distress. She has no wheezes. She has no rales.  Abdominal: Bowel sounds are normal.  Musculoskeletal: Normal range of motion.  Lymphadenopathy:    She has no  cervical adenopathy.  Neurological: She is alert and oriented to person, place, and time.  Skin: Skin is intact. No lesion and no rash noted.  Psychiatric: She has a normal mood and affect. Her speech is normal and behavior is normal. Thought content normal.      Assessment & Plan:     1. Acute pansinusitis, recurrence not specified Having tenderness throughout all frontal, maxillary and ethmoid sinuses to any palpation. Also, having rhinorrhea, cough, chills and body aches. No documented fever. Will treat with Bromfed-DM and Z-pak. Increase fluid intake. Recheck if no better in 5-7 days. Add Nasacort 2 sprays each nostril at bedtime. Given note for out of work 2 days to get treatment started well. - brompheniramine-pseudoephedrine-DM (BROMFED DM) 30-2-10 MG/5ML syrup; Take 5 mLs by mouth 4 (four) times daily as needed.  Dispense: 120 mL; Refill: 0 - azithromycin (ZITHROMAX) 250 MG tablet; Take 2 tablets by mouth today then one daily for 4 days.  Dispense: 6 tablet; Refill: 0  2. Mild intermittent asthma without complication Having ticklish cough but wheezing controlled with use of Advair daily and Ventolin prn.  3. Allergic rhinitis, unspecified seasonality, unspecified trigger Sneezing, rhinorrhea and cough with congestion over the past 3-4 days. Will treat with Nasacort and Bromfed-DM. Recheck prn. - brompheniramine-pseudoephedrine-DM (BROMFED DM) 30-2-10 MG/5ML syrup; Take 5 mLs by mouth 4 (four) times daily as needed.  Dispense: 120 mL; Refill: 0

## 2016-09-23 ENCOUNTER — Other Ambulatory Visit: Payer: Self-pay | Admitting: Family Medicine

## 2016-11-07 ENCOUNTER — Other Ambulatory Visit: Payer: Self-pay | Admitting: Family Medicine

## 2016-11-07 MED ORDER — TRAZODONE HCL 100 MG PO TABS: 200.0000 mg | ORAL_TABLET | Freq: Every day | ORAL | 3 refills | Status: DC

## 2016-11-07 MED ORDER — ALBUTEROL SULFATE HFA 108 (90 BASE) MCG/ACT IN AERS
2.0000 | INHALATION_SPRAY | Freq: Four times a day (QID) | RESPIRATORY_TRACT | 3 refills | Status: DC | PRN
Start: 2016-11-07 — End: 2018-04-30

## 2016-11-07 MED ORDER — FLUTICASONE-SALMETEROL 250-50 MCG/DOSE IN AEPB: 1.0000 | INHALATION_SPRAY | Freq: Two times a day (BID) | RESPIRATORY_TRACT | 3 refills | Status: DC

## 2016-11-07 NOTE — Telephone Encounter (Signed)
Pt needs new refill on  Albuterol inhaler  Trazodone 100mg   Advair 250-50  She is using [facility] now  She is going out of town and request if this can be filled today.  teri

## 2017-01-09 ENCOUNTER — Emergency Department
Admission: EM | Admit: 2017-01-09 | Discharge: 2017-01-09 | Disposition: A | Discharge status: Home / Self Care | Payer: Self-pay | Attending: Emergency Medicine | Admitting: Emergency Medicine

## 2017-01-09 ENCOUNTER — Emergency Department: Payer: Self-pay

## 2017-01-09 DIAGNOSIS — R079 Chest pain, unspecified: Secondary | ICD-10-CM | POA: Insufficient documentation

## 2017-01-09 DIAGNOSIS — Z5321 Procedure and treatment not carried out due to patient leaving prior to being seen by health care provider: Secondary | ICD-10-CM | POA: Insufficient documentation

## 2017-01-09 LAB — BASIC METABOLIC PANEL
Anion gap: 7 (ref 5–15)
BUN: 11 mg/dL (ref 6–20)
CO2: 29 mmol/L (ref 22–32)
Calcium: 9 mg/dL (ref 8.9–10.3)
Chloride: 103 mmol/L (ref 101–111)
Creatinine, Ser: 0.81 mg/dL (ref 0.44–1.00)
GFR calc Af Amer: >60 mL/min (ref >60)
GFR calc non Af Amer: >60 mL/min (ref >60)
Glucose, Bld: 102 mg/dL — ABNORMAL HIGH (ref 65–99)
Potassium: 3.5 mmol/L (ref 3.5–5.1)
Sodium: 139 mmol/L (ref 135–145)

## 2017-01-09 LAB — CBC
HCT: 42.8 % (ref 35.0–47.0)
Hemoglobin: 14.5 g/dL (ref 12.0–16.0)
MCH: 28.7 pg (ref 26.0–34.0)
MCHC: 33.9 g/dL (ref 32.0–36.0)
MCV: 84.6 fL (ref 80.0–100.0)
Platelets: 244 10*3/uL (ref 150–440)
RBC: 5.06 MIL/uL (ref 3.80–5.20)
RDW: 13.2 % (ref 11.5–14.5)
WBC: 6.8 10*3/uL (ref 3.6–11.0)

## 2017-01-09 LAB — TROPONIN I: Troponin I: <0.03 ng/mL (ref <0.03)

## 2017-01-09 NOTE — ED Notes (Signed)
No answer when called several times from lobby 

## 2017-01-09 NOTE — ED Triage Notes (Signed)
Patient reports having mid sternal chest pain that radiates into left jaw, described as sharpe/pressure.  + short of breath and nausea.  Reports pain for approximately 45 minutes.  States "if I could just throw up I would feel better, I ate a hamburger to fast".

## 2017-01-10 ENCOUNTER — Telehealth: Payer: Self-pay | Admitting: Emergency Medicine

## 2017-01-10 NOTE — Telephone Encounter (Signed)
.  Called patient due to lwot to inquire about condition and follow up plans. Says she has already called pcp and he is going to look at her ED chart/labs.

## 2017-10-16 DIAGNOSIS — S46912D Strain of unspecified muscle, fascia and tendon at shoulder and upper arm level, left arm, subsequent encounter: Secondary | ICD-10-CM | POA: Diagnosis not present

## 2017-10-16 DIAGNOSIS — R200 Anesthesia of skin: Secondary | ICD-10-CM | POA: Diagnosis not present

## 2018-02-06 ENCOUNTER — Other Ambulatory Visit: Payer: Self-pay | Admitting: Family Medicine

## 2018-02-06 NOTE — Telephone Encounter (Signed)
Patient is totally out of Advair and needs this today please.

## 2018-04-29 ENCOUNTER — Other Ambulatory Visit: Payer: Self-pay | Admitting: Family Medicine

## 2018-04-30 NOTE — Telephone Encounter (Signed)
Advise patient to schedule appointment, also.

## 2018-04-30 NOTE — Telephone Encounter (Signed)
LMTCB-Patient need an office visit or physical last seen was in 2018.

## 2018-06-28 ENCOUNTER — Encounter: Payer: Self-pay | Admitting: Family Medicine

## 2018-06-28 ENCOUNTER — Ambulatory Visit: Payer: BLUE CROSS/BLUE SHIELD | Admitting: Family Medicine

## 2018-06-28 ENCOUNTER — Other Ambulatory Visit: Payer: Self-pay

## 2018-06-28 VITALS — BP 110/84 | HR 83 | Temp 98.2°F | Resp 16 | Wt 186.0 lb

## 2018-06-28 DIAGNOSIS — J4520 Mild intermittent asthma, uncomplicated: Secondary | ICD-10-CM

## 2018-06-28 DIAGNOSIS — G43109 Migraine with aura, not intractable, without status migrainosus: Secondary | ICD-10-CM

## 2018-06-28 DIAGNOSIS — R609 Edema, unspecified: Secondary | ICD-10-CM

## 2018-06-28 DIAGNOSIS — F411 Generalized anxiety disorder: Secondary | ICD-10-CM

## 2018-06-28 MED ORDER — FUROSEMIDE 20 MG PO TABS: ORAL_TABLET | ORAL | 0 refills | Status: DC

## 2018-06-28 MED ORDER — SUMATRIPTAN SUCCINATE 50 MG PO TABS: ORAL_TABLET | ORAL | 0 refills | Status: AC

## 2018-06-28 MED ORDER — HYDROXYZINE HCL 50 MG PO TABS: ORAL_TABLET | ORAL | 3 refills | Status: DC

## 2018-06-28 MED ORDER — ALBUTEROL SULFATE 108 (90 BASE) MCG/ACT IN AEPB: 2.0000 | INHALATION_SPRAY | Freq: Four times a day (QID) | RESPIRATORY_TRACT | 12 refills | Status: DC | PRN

## 2018-06-28 MED ORDER — FLUTICASONE-SALMETEROL 250-50 MCG/DOSE IN AEPB
INHALATION_SPRAY | RESPIRATORY_TRACT | 3 refills | Status: DC
Start: 2018-06-28 — End: 2019-10-04

## 2018-06-28 NOTE — Progress Notes (Signed)
Patient: Stephanie Spears Female    DOB: [birth year]   48 y.o.   MRN: [id number] Visit Date: 06/28/2018  Today's Provider: Dortha Kern, PA   Follow up: Anxiety                   Migraines                   Asthma  Subjective:     HPI   Anxiety, generalized From 05/22/2015-no changes. Continue present regimen and follow up routine labs. Was not able to recheck labs due to loss of job. Has regained employment. Finds the Hydroxyzine helps control itching and allows her to rest better.  Asthma, mild persistent, uncomplicated From 05/22/2015-no changes. Stable. Request refills of Advair and Ventolin. No significant flares recently.   Past Medical History:  Diagnosis Date  . Anxiety   . Asthma    Past Surgical History:  Procedure Laterality Date  . ABDOMINAL HYSTERECTOMY    . ANTERIOR CERVICAL DECOMP/DISCECTOMY FUSION N/A 10/11/2012   Procedure: CERVICAL FIVE-SIX, CERVICAL SIX-SEVEN ANTERIOR CERVICAL DECOMPRESSION/DISCECTOMY FUSION 2 LEVELS;  Surgeon: Karn Cassis, MD;  Location: [facility];  Service: Neurosurgery;  Laterality: N/A;  C56 C67 anterior cervical decompression with fusion plating and bonegraft  . CARPAL TUNNEL RELEASE Right 2011  . INCONTINENCE SURGERY  09-20-2012   Family History  Problem Relation Age of Onset  . Emphysema Father   . Lung disease Father   . Diabetes Maternal Grandmother   . Stroke Maternal Grandmother   . COPD Maternal Grandfather   . Lung cancer Paternal Grandfather    Allergies  Allergen Reactions  . Aspirin Shortness Of Breath  . Penicillins Anaphylaxis    Current Outpatient Medications:  .  ADVAIR DISKUS 250-50 MCG/DOSE AEPB, INHALE 1 PUFF BY MOUTH EVERY 12 HOURS, Disp: 60 each, Rfl: 3 .  furosemide (LASIX) 20 MG tablet, take 1 tablet by mouth once daily if needed for edema, Disp: 30 tablet, Rfl: 0 .  hydrOXYzine (ATARAX/VISTARIL) 50 MG tablet, take 1 tablet by mouth three times a day if needed for itching, Disp: 60 tablet,  Rfl: 1 .  ibuprofen (ADVIL,MOTRIN) 800 MG tablet, Take 1 tablet (800 mg total) by mouth every 8 (eight) hours as needed for mild pain or moderate pain., Disp: 30 tablet, Rfl: 1 .  SUMAtriptan (IMITREX) 50 MG tablet, May repeat in 2 hours if headache persists or recurs. Limit to 2 doses in any 24 hours., Disp: 10 tablet, Rfl: 0 .  VENTOLIN HFA 108 (90 Base) MCG/ACT inhaler, INHALE 2 PUFFS BY MOUTH EVERY 6 HOURS ASNEEDED WHEEZING, Disp: 18 g, Rfl: 0  Review of Systems  Constitutional: Negative for appetite change, chills, fatigue and fever.  Respiratory: Negative for chest tightness and shortness of breath.   Cardiovascular: Negative for chest pain and palpitations.  Gastrointestinal: Negative for abdominal pain, nausea and vomiting.  Neurological: Negative for dizziness and weakness.   Social History   Tobacco Use  . Smoking status: Never Smoker  . Smokeless tobacco: Never Used  Substance Use Topics  . Alcohol use: No     Objective:   BP 110/84 (BP Location: Right Arm, Patient Position: Sitting, Cuff Size: Large)   Pulse 83   Temp 98.2 F (36.8 C) (Oral)   Resp 16   Wt 186 lb (84.4 kg)   SpO2 95%   BMI 37.57 kg/m    Wt Readings from Last 3 Encounters:  06/28/18 186  lb (84.4 kg)  01/09/17 180 lb (81.6 kg)  08/02/16 190 lb 3.2 oz (86.3 kg)   Vitals:   06/28/18 0906  BP: 110/84  Pulse: 83  Resp: 16  Temp: 98.2 F (36.8 C)  TempSrc: Oral  SpO2: 95%  Weight: 186 lb (84.4 kg)   Physical Exam Constitutional:      General: She is not in acute distress.    Appearance: She is well-developed.  HENT:     Head: Normocephalic and atraumatic.     Right Ear: Hearing and tympanic membrane normal.     Left Ear: Hearing and tympanic membrane normal.     Nose: Nose normal.     Mouth/Throat:     Comments: Slight irritation bilateral posterior pharynx from PND. Eyes:     General: Lids are normal. No scleral icterus.       Right eye: No discharge.        Left eye: No discharge.      Conjunctiva/sclera: Conjunctivae normal.  Pulmonary:     Effort: Pulmonary effort is normal. No respiratory distress.     Breath sounds: Normal breath sounds.     Comments: Minimal wheeze at end of expiration. Abdominal:     General: Bowel sounds are normal.     Palpations: Abdomen is soft.  Musculoskeletal: Normal range of motion.  Skin:    Findings: No lesion or rash.  Neurological:     Mental Status: She is alert and oriented to person, place, and time.  Psychiatric:        Speech: Speech normal.        Behavior: Behavior normal.        Thought Content: Thought content normal.       Assessment & Plan     1. Anxiety, generalized Anxiety usually manifested as "skin picking". Hydroxyzine controls anxiety and itching but can cause some sleepiness. She takes it primarily at bedtime only to lessen the drowsiness. Refilled prescription and may need recheck in 2-3 months pending reponse. - hydrOXYzine (ATARAX/VISTARIL) 50 MG tablet; take 1 tablet by mouth three times a day if needed for itching  Dispense: 60 tablet; Refill: 3  2. Mild intermittent asthma without complication Feels asthma well controlled when using Advair and spirometry confirms severe obstruction but no significant wheezing on auscultation today. Will refill Advair and Proair Respiclick (alternative to Ventolin her insurance will cover). Recheck prn. - Spirometry with Graph - Albuterol Sulfate (PROAIR RESPICLICK) 108 (90 Base) MCG/ACT AEPB; Inhale 2 puffs into the lungs every 6 (six) hours as needed.  Dispense: 1 each; Refill: 12 - Fluticasone-Salmeterol (ADVAIR DISKUS) 250-50 MCG/DOSE AEPB; INHALE 1 PUFF BY MOUTH EVERY 12 HOURS  Dispense: 60 each; Refill: 3  3. Migraine with aura and without status migrainosus, not intractable Usually can abort headache if Ibuprofen used early in the process. No side effects of Imitrex. Requests refill to have available if unable to stop migraine. - SUMAtriptan (IMITREX) 50 MG  tablet; May repeat in 2 hours if headache persists or recurs. Limit to 2 doses in any 24 hours.  Dispense: 10 tablet; Refill: 0  4. Peripheral edema Rare recurrences unless she sits for long periods with feet down/dependent. No varicose veins noted and no edema today. Occasional use of Lasix clears edema if it happens. Recheck if more occurrences or peristence. - furosemide (LASIX) 20 MG tablet; take 1 tablet by mouth once daily if needed for edema  Dispense: 30 tablet; Refill: 0     Dortha Kern,  [facility]

## 2018-07-11 ENCOUNTER — Other Ambulatory Visit: Payer: Self-pay | Admitting: Family Medicine

## 2018-07-11 NOTE — Telephone Encounter (Signed)
Please Review

## 2018-07-13 ENCOUNTER — Other Ambulatory Visit: Payer: Self-pay | Admitting: Family Medicine

## 2018-07-16 ENCOUNTER — Encounter: Payer: Self-pay | Admitting: Family Medicine

## 2018-07-16 ENCOUNTER — Ambulatory Visit: Payer: BLUE CROSS/BLUE SHIELD | Admitting: Family Medicine

## 2018-07-16 ENCOUNTER — Other Ambulatory Visit: Payer: Self-pay

## 2018-07-16 VITALS — BP 118/66 | HR 80 | Temp 98.1°F | Wt 187.0 lb

## 2018-07-16 DIAGNOSIS — F411 Generalized anxiety disorder: Secondary | ICD-10-CM

## 2018-07-16 DIAGNOSIS — J45901 Unspecified asthma with (acute) exacerbation: Secondary | ICD-10-CM

## 2018-07-16 MED ORDER — LEVALBUTEROL HCL 1.25 MG/0.5ML IN NEBU: 1.2500 mg | INHALATION_SOLUTION | Freq: Once | RESPIRATORY_TRACT | Status: AC

## 2018-07-16 MED ORDER — PREDNISONE 10 MG PO TABS: ORAL_TABLET | ORAL | 0 refills | Status: DC

## 2018-07-16 NOTE — Progress Notes (Signed)
Patient: Stephanie Spears Female    DOB: [birth year]   48 y.o.   MRN: [id number] Visit Date: 07/16/2018  Today's Provider: Dortha Kern, PA   Chief Complaint  Patient presents with  . Asthma   Subjective:     Asthma  She complains of cough, shortness of breath and wheezing. The problem has been gradually worsening. The cough is non-productive. Associated symptoms include a fever, headaches and malaise/fatigue. Pertinent negatives include no appetite change, postnasal drip, sneezing or sore throat. Her past medical history is significant for asthma.   Past Medical History:  Diagnosis Date  . Anxiety   . Asthma    Patient Active Problem List   Diagnosis Date Noted  . Anxiety, generalized 08/25/2014  . Back pain with radiation 08/25/2014  . Carpal tunnel syndrome 08/25/2014  . Insomnia, persistent 08/25/2014  . Blood pressure elevated 08/25/2014  . Dermatitis, nummular 08/25/2014  . H/O arthrodesis 08/25/2014  . Does not feel right 08/25/2014  . Depression, major, recurrent, mild (HCC) 08/25/2014  . Headache, migraine 08/25/2014  . Ache in joint 11/09/2009  . Menopausal symptom 10/26/2009  . Cannot sleep 09/10/2009  . Excess weight 01/06/2009  . Disease of hair 12/19/2008  . Cephalalgia 12/19/2008  . Carbuncle and furuncle 08/08/2008  . Asthma 08/06/2007  . Open wound of breast 05/31/2007   Past Surgical History:  Procedure Laterality Date  . ABDOMINAL HYSTERECTOMY    . ANTERIOR CERVICAL DECOMP/DISCECTOMY FUSION N/A 10/11/2012   Procedure: CERVICAL FIVE-SIX, CERVICAL SIX-SEVEN ANTERIOR CERVICAL DECOMPRESSION/DISCECTOMY FUSION 2 LEVELS;  Surgeon: Karn Cassis, MD;  Location: [facility];  Service: Neurosurgery;  Laterality: N/A;  C56 C67 anterior cervical decompression with fusion plating and bonegraft  . CARPAL TUNNEL RELEASE Right 2011  . INCONTINENCE SURGERY  09-20-2012   Family History  Problem Relation Age of Onset  . Emphysema Father   . Lung disease  Father   . Diabetes Maternal Grandmother   . Stroke Maternal Grandmother   . COPD Maternal Grandfather   . Lung cancer Paternal Grandfather    Allergies  Allergen Reactions  . Aspirin Shortness Of Breath  . Penicillins Anaphylaxis    Current Outpatient Medications:  .  Albuterol Sulfate (PROAIR RESPICLICK) 108 (90 Base) MCG/ACT AEPB, Inhale 2 puffs into the lungs every 6 (six) hours as needed., Disp: 1 each, Rfl: 12 .  Fluticasone-Salmeterol (ADVAIR DISKUS) 250-50 MCG/DOSE AEPB, INHALE 1 PUFF BY MOUTH EVERY 12 HOURS, Disp: 60 each, Rfl: 3 .  furosemide (LASIX) 20 MG tablet, take 1 tablet by mouth once daily if needed for edema, Disp: 30 tablet, Rfl: 0 .  hydrOXYzine (ATARAX/VISTARIL) 50 MG tablet, take 1 tablet by mouth three times a day if needed for itching, Disp: 60 tablet, Rfl: 3 .  ibuprofen (ADVIL,MOTRIN) 800 MG tablet, Take 1 tablet (800 mg total) by mouth every 8 (eight) hours as needed for mild pain or moderate pain., Disp: 30 tablet, Rfl: 1 .  SUMAtriptan (IMITREX) 50 MG tablet, May repeat in 2 hours if headache persists or recurs. Limit to 2 doses in any 24 hours., Disp: 10 tablet, Rfl: 0 .  VENTOLIN HFA 108 (90 Base) MCG/ACT inhaler, INHALE 2 PUFFS BY MOUTH EVERY 6 HOURS ASNEEDED WHEEZING, Disp: 18 g, Rfl: 6  Review of Systems  Constitutional: Positive for chills, diaphoresis, fatigue, fever and malaise/fatigue. Negative for activity change, appetite change and unexpected weight change.  HENT: Negative.  Negative for congestion, postnasal drip, sneezing and sore throat.  Respiratory: Positive for cough, chest tightness, shortness of breath and wheezing. Negative for apnea.   Cardiovascular: Negative.   Gastrointestinal: Negative.   Neurological: Positive for headaches. Negative for dizziness and light-headedness.   Social History   Tobacco Use  . Smoking status: Never Smoker  . Smokeless tobacco: Never Used  Substance Use Topics  . Alcohol use: No      Objective:    BP 118/66 (BP Location: Right Arm, Patient Position: Sitting, Cuff Size: Large)   Pulse 80   Temp 98.1 F (36.7 C) (Oral)   Wt 187 lb (84.8 kg)   SpO2 95%   BMI 37.77 kg/m  Vitals:   07/16/18 1330  BP: 118/66  Pulse: 80  Temp: 98.1 F (36.7 C)  TempSrc: Oral  SpO2: 95%  Weight: 187 lb (84.8 kg)   Physical Exam Constitutional:      General: She is not in acute distress.    Appearance: She is well-developed.  HENT:     Head: Normocephalic and atraumatic.     Right Ear: Hearing normal.     Left Ear: Hearing normal.     Nose: Nose normal.  Eyes:     General: Lids are normal. No scleral icterus.       Right eye: No discharge.        Left eye: No discharge.     Conjunctiva/sclera: Conjunctivae normal.  Neck:     Musculoskeletal: Neck supple.  Cardiovascular:     Rate and Rhythm: Regular rhythm.     Heart sounds: Normal heart sounds.  Pulmonary:     Effort: Pulmonary effort is normal. No respiratory distress.     Breath sounds: Wheezing present. No rhonchi.     Comments: Tight wheezes with chest soreness. Abdominal:     General: Bowel sounds are normal.     Palpations: Abdomen is soft.  Musculoskeletal: Normal range of motion.  Skin:    Findings: No lesion.  Neurological:     Mental Status: She is alert and oriented to person, place, and time.  Psychiatric:        Speech: Speech normal.        Behavior: Behavior normal.        Thought Content: Thought content normal.       Assessment & Plan    1. Moderate asthma with acute exacerbation, unspecified whether persistent Has had tight wheezing causing cough, fatigue, chest soreness and some sweats without fever (highest temperature was 99.6 at home) over the past week. No COVID-19 positive patient exposure. Unable to get the Respiclick inhaler to activate due to tight asthma attack limiting inspirations. Given Xopenex nebulizer treatment and feeling more open with less chest discomfort. No rales or rhonchi heard.  Will go back to the Ventolin-HFA inhaler with Advair and add a prednisone 10 mg 12 day taper. Call report of progress in 3 days. May use nebulizer with Duoneb at home QID prn. Given COVID-19 stay at home precautions/restrictions. Feels asthma does not allow use of face covering or mask. - levalbuterol (XOPENEX) nebulizer solution 1.25 mg - predniSONE (DELTASONE) 10 MG tablet; Taper down 1 tablet by mouth every 2 days (6,6,5,5,4,4,3,3,2,2,1,1)  Dispense: 42 tablet; Refill: 0  2. Anxiety, generalized Better control with use of the Hydroxyzine. Sleeping difficult with asthma exacerbation. No longer having itching.      Dortha Kernennis Elide Stalzer, PA  [facility]

## 2018-08-03 DIAGNOSIS — R112 Nausea with vomiting, unspecified: Secondary | ICD-10-CM | POA: Diagnosis not present

## 2018-08-03 DIAGNOSIS — J209 Acute bronchitis, unspecified: Secondary | ICD-10-CM | POA: Diagnosis not present

## 2018-08-03 DIAGNOSIS — R0602 Shortness of breath: Secondary | ICD-10-CM | POA: Diagnosis not present

## 2018-08-03 DIAGNOSIS — R197 Diarrhea, unspecified: Secondary | ICD-10-CM | POA: Diagnosis not present

## 2018-10-30 DIAGNOSIS — Z20828 Contact with and (suspected) exposure to other viral communicable diseases: Secondary | ICD-10-CM | POA: Diagnosis not present

## 2018-11-19 ENCOUNTER — Ambulatory Visit (INDEPENDENT_AMBULATORY_CARE_PROVIDER_SITE_OTHER): Payer: BC Managed Care – PPO | Admitting: Family Medicine

## 2018-11-19 ENCOUNTER — Other Ambulatory Visit: Payer: Self-pay

## 2018-11-19 ENCOUNTER — Encounter: Payer: Self-pay | Admitting: Family Medicine

## 2018-11-19 VITALS — BP 122/82 | HR 46 | Temp 98.1°F | Wt 191.0 lb

## 2018-11-19 DIAGNOSIS — F418 Other specified anxiety disorders: Secondary | ICD-10-CM | POA: Diagnosis not present

## 2018-11-19 DIAGNOSIS — F424 Excoriation (skin-picking) disorder: Secondary | ICD-10-CM | POA: Diagnosis not present

## 2018-11-19 MED ORDER — FLUOXETINE HCL 20 MG PO TABS: 20.0000 mg | ORAL_TABLET | Freq: Every day | ORAL | 3 refills | Status: DC

## 2018-11-19 NOTE — Progress Notes (Signed)
Stephanie PattenKathy D Spears  MRN: [id number] DOB: 11-21-1970  Subjective:  HPI   The patient is a 48 year old female who present for follow up of her depression and anxiety.  She reports that she is having more difficulty dealing with it currently.  She has been having increased anxiety as well.  She states that when she gets like this she struggles with 'skin picking'.  She states that this episode of exacerbated symptoms has been going on for 3 weeks.  She has scarring and sores on her arms and some new ones on her upper back legs.  Patient Active Problem List   Diagnosis Date Noted  . Anxiety, generalized 08/25/2014  . Back pain with radiation 08/25/2014  . Carpal tunnel syndrome 08/25/2014  . Insomnia, persistent 08/25/2014  . Blood pressure elevated 08/25/2014  . Dermatitis, nummular 08/25/2014  . H/O arthrodesis 08/25/2014  . Does not feel right 08/25/2014  . Depression, major, recurrent, mild (HCC) 08/25/2014  . Headache, migraine 08/25/2014  . Ache in joint 11/09/2009  . Menopausal symptom 10/26/2009  . Cannot sleep 09/10/2009  . Excess weight 01/06/2009  . Disease of hair 12/19/2008  . Cephalalgia 12/19/2008  . Carbuncle and furuncle 08/08/2008  . Asthma 08/06/2007  . Open wound of breast 05/31/2007   Past Medical History:  Diagnosis Date  . Anxiety   . Asthma    Past Surgical History:  Procedure Laterality Date  . ABDOMINAL HYSTERECTOMY    . ANTERIOR CERVICAL DECOMP/DISCECTOMY FUSION N/A 10/11/2012   Procedure: CERVICAL FIVE-SIX, CERVICAL SIX-SEVEN ANTERIOR CERVICAL DECOMPRESSION/DISCECTOMY FUSION 2 LEVELS;  Surgeon: Karn CassisErnesto M Botero, MD;  Location: [facility];  Service: Neurosurgery;  Laterality: N/A;  C56 C67 anterior cervical decompression with fusion plating and bonegraft  . CARPAL TUNNEL RELEASE Right 2011  . INCONTINENCE SURGERY  09-20-2012   Family History  Problem Relation Age of Onset  . Emphysema Father   . Lung disease Father   . Diabetes Maternal Grandmother    . Stroke Maternal Grandmother   . COPD Maternal Grandfather   . Lung cancer Paternal Grandfather    Social History   Socioeconomic History  . Marital status: Widowed    Spouse name: Not on file  . Number of children: Not on file  . Years of education: Not on file  . Highest education level: Not on file  Occupational History  . Not on file  Social Needs  . Financial resource strain: Not on file  . Food insecurity    Worry: Not on file    Inability: Not on file  . Transportation needs    Medical: Not on file    Non-medical: Not on file  Tobacco Use  . Smoking status: Never Smoker  . Smokeless tobacco: Never Used  Substance and Sexual Activity  . Alcohol use: No  . Drug use: No  . Sexual activity: Not on file  Lifestyle  . Physical activity    Days per week: Not on file    Minutes per session: Not on file  . Stress: Not on file  Relationships  . Social Musicianconnections    Talks on phone: Not on file    Gets together: Not on file    Attends religious service: Not on file    Active member of club or organization: Not on file    Attends meetings of clubs or organizations: Not on file    Relationship status: Not on file  . Intimate partner violence    Fear  of current or ex partner: Not on file    Emotionally abused: Not on file    Physically abused: Not on file    Forced sexual activity: Not on file  Other Topics Concern  . Not on file  Social History Narrative  . Not on file   Allergies  Allergen Reactions  . Aspirin Shortness Of Breath  . Penicillins Anaphylaxis   Outpatient Encounter Medications as of 11/19/2018  Medication Sig  . Fluticasone-Salmeterol (ADVAIR DISKUS) 250-50 MCG/DOSE AEPB INHALE 1 PUFF BY MOUTH EVERY 12 HOURS  . furosemide (LASIX) 20 MG tablet take 1 tablet by mouth once daily if needed for edema  . hydrOXYzine (ATARAX/VISTARIL) 50 MG tablet take 1 tablet by mouth three times a day if needed for itching  . ibuprofen (ADVIL,MOTRIN) 800 MG tablet  Take 1 tablet (800 mg total) by mouth every 8 (eight) hours as needed for mild pain or moderate pain.  . SUMAtriptan (IMITREX) 50 MG tablet May repeat in 2 hours if headache persists or recurs. Limit to 2 doses in any 24 hours.  . VENTOLIN HFA 108 (90 Base) MCG/ACT inhaler INHALE 2 PUFFS BY MOUTH EVERY 6 HOURS ASNEEDED WHEEZING  . [DISCONTINUED] predniSONE (DELTASONE) 10 MG tablet Taper down 1 tablet by mouth every 2 days (6,6,5,5,4,4,3,3,2,2,1,1)   Facility-Administered Encounter Medications as of 11/19/2018  Medication  . levalbuterol (XOPENEX) nebulizer solution 1.25 mg   Review of Systems  Constitutional: Negative for fever and malaise/fatigue.  HENT: Negative for congestion, ear pain, sinus pain and sore throat.   Respiratory: Negative for cough, shortness of breath and wheezing.   Musculoskeletal: Negative for myalgias.  Psychiatric/Behavioral: Positive for depression. The patient is nervous/anxious.     Objective:  BP 122/82 (BP Location: Right Arm, Patient Position: Sitting, Cuff Size: Normal)   Pulse (!) 46   Temp 98.1 F (36.7 C) (Oral)   Wt 191 lb (86.6 kg)   SpO2 97%   BMI 38.58 kg/m   Physical Exam  Constitutional: She is oriented to person, place, and time and well-developed, well-nourished, and in no distress.  HENT:  Head: Normocephalic.  Eyes: Conjunctivae are normal.  Neck: Neck supple.  Cardiovascular: Normal rate and regular rhythm.  Pulmonary/Chest: Effort normal and breath sounds normal.  Abdominal: Soft. Bowel sounds are normal.  Musculoskeletal: Normal range of motion.  Neurological: She is alert and oriented to person, place, and time.  Skin: No rash noted.  Multiple scars with some open sores from dermatillomania (skin picking) on forearms and a few on posterior thighs.  Psychiatric: Affect and judgment normal. Her mood appears anxious. She exhibits a depressed mood. She expresses no suicidal plans and no homicidal plans.    Assessment and Plan :    1. Anxiety with depression No sleeping well at night, skipping meals and feeling very anxious with son getting ready to go back to school. Also, having skin picking disorder flare. Will get routine labs and start Fluoxetine daily. Recheck pending reports. - CBC with Differential/Platelet - Comprehensive metabolic panel - TSH - FLUoxetine (PROZAC) 20 MG tablet; Take 1 tablet (20 mg total) by mouth daily.  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 Will treat this symptoms of OCD with Fluoxetine and get routine labs. Plan follow up pending reports. - CBC with Differential/Platelet - Comprehensive metabolic panel - TSH - FLUoxetine (PROZAC) 20 MG tablet; Take  1 tablet (20 mg total) by mouth daily.  Dispense: 30 tablet; Refill: 3

## 2018-12-28 ENCOUNTER — Other Ambulatory Visit: Payer: Self-pay

## 2018-12-28 ENCOUNTER — Telehealth: Payer: Self-pay

## 2018-12-28 ENCOUNTER — Other Ambulatory Visit: Payer: Self-pay | Admitting: Family Medicine

## 2018-12-28 ENCOUNTER — Ambulatory Visit
Admission: RE | Admit: 2018-12-28 | Discharge: 2018-12-28 | Disposition: A | Discharge status: Home / Self Care | Payer: Worker's Compensation | Attending: Family Medicine | Admitting: Family Medicine

## 2018-12-28 ENCOUNTER — Ambulatory Visit (INDEPENDENT_AMBULATORY_CARE_PROVIDER_SITE_OTHER): Payer: BC Managed Care – PPO | Admitting: Family Medicine

## 2018-12-28 ENCOUNTER — Encounter: Payer: Self-pay | Admitting: Family Medicine

## 2018-12-28 ENCOUNTER — Ambulatory Visit
Admission: RE | Admit: 2018-12-28 | Discharge: 2018-12-28 | Disposition: A | Discharge status: Home / Self Care | Payer: BC Managed Care – PPO | Source: Ambulatory Visit | Attending: Family Medicine | Admitting: Family Medicine

## 2018-12-28 VITALS — BP 122/72 | HR 66 | Temp 96.9°F | Resp 18 | Wt 192.4 lb

## 2018-12-28 DIAGNOSIS — M25572 Pain in left ankle and joints of left foot: Secondary | ICD-10-CM

## 2018-12-28 DIAGNOSIS — M7989 Other specified soft tissue disorders: Secondary | ICD-10-CM | POA: Diagnosis not present

## 2018-12-28 DIAGNOSIS — S99912A Unspecified injury of left ankle, initial encounter: Secondary | ICD-10-CM | POA: Diagnosis not present

## 2018-12-28 DIAGNOSIS — G4700 Insomnia, unspecified: Secondary | ICD-10-CM

## 2018-12-28 DIAGNOSIS — F418 Other specified anxiety disorders: Secondary | ICD-10-CM

## 2018-12-28 MED ORDER — TRAZODONE HCL 50 MG PO TABS: 25.0000 mg | ORAL_TABLET | Freq: Every evening | ORAL | 3 refills | Status: DC | PRN

## 2018-12-28 NOTE — Telephone Encounter (Signed)
-----   Message from Margo Common, [place] sent at 12/28/2018  2:41 PM EDT ----- No bony abnormality. Use ankle brace for support (easy to find at a pharmacy) and elevate as much as possible.

## 2018-12-28 NOTE — Progress Notes (Signed)
Patient: Stephanie Spears Female    DOB: Feb 28, 1971   48 y.o.   MRN: [id number] Visit Date: 12/28/2018  Today's Provider: Vernie Murders, PA   Chief Complaint  Patient presents with  . Ankle Injury   Subjective:     HPI  Patient injured left ankle last Friday at work when she stepped off a step stool working in a top file drawer and fell. She is currently taking Tylenol for the pain after applying ice packs.. .   Past Medical History:  Diagnosis Date  . Anxiety   . Asthma    Past Surgical History:  Procedure Laterality Date  . ABDOMINAL HYSTERECTOMY    . ANTERIOR CERVICAL DECOMP/DISCECTOMY FUSION N/A 10/11/2012   Procedure: CERVICAL FIVE-SIX, CERVICAL SIX-SEVEN ANTERIOR CERVICAL DECOMPRESSION/DISCECTOMY FUSION 2 LEVELS;  Surgeon: Floyce Stakes, MD;  Location: [facility];  Service: Neurosurgery;  Laterality: N/A;  C56 C67 anterior cervical decompression with fusion plating and bonegraft  . CARPAL TUNNEL RELEASE Right 2011  . INCONTINENCE SURGERY  09-20-2012   Family History  Problem Relation Age of Onset  . Emphysema Father   . Lung disease Father   . Diabetes Maternal Grandmother   . Stroke Maternal Grandmother   . COPD Maternal Grandfather   . Lung cancer Paternal Grandfather    Allergies  Allergen Reactions  . Aspirin Shortness Of Breath  . Penicillins Anaphylaxis    Current Outpatient Medications:  .  Fluticasone-Salmeterol (ADVAIR DISKUS) 250-50 MCG/DOSE AEPB, INHALE 1 PUFF BY MOUTH EVERY 12 HOURS, Disp: 60 each, Rfl: 3 .  furosemide (LASIX) 20 MG tablet, take 1 tablet by mouth once daily if needed for edema, Disp: 30 tablet, Rfl: 0 .  hydrOXYzine (ATARAX/VISTARIL) 50 MG tablet, take 1 tablet by mouth three times a day if needed for itching, Disp: 60 tablet, Rfl: 3 .  SUMAtriptan (IMITREX) 50 MG tablet, May repeat in 2 hours if headache persists or recurs. Limit to 2 doses in any 24 hours., Disp: 10 tablet, Rfl: 0 .  VENTOLIN HFA 108 (90 Base) MCG/ACT  inhaler, INHALE 2 PUFFS BY MOUTH EVERY 6 HOURS ASNEEDED WHEEZING, Disp: 18 g, Rfl: 6 .  FLUoxetine (PROZAC) 20 MG tablet, Take 1 tablet (20 mg total) by mouth daily. (Patient not taking: Reported on 12/28/2018), Disp: 30 tablet, Rfl: 3  Current Facility-Administered Medications:  .  levalbuterol (XOPENEX) nebulizer solution 1.25 mg, 1.25 mg, Nebulization, Once, Chrismon, Vickki Muff, PA  Review of Systems  Musculoskeletal: Positive for joint swelling.   Social History   Tobacco Use  . Smoking status: Never Smoker  . Smokeless tobacco: Never Used  Substance Use Topics  . Alcohol use: No     Objective:   BP 122/72 (BP Location: Right Arm, Patient Position: Sitting, Cuff Size: Large)   Pulse 66   Temp (!) 96.9 F (36.1 C) (Temporal)   Resp 18   Wt 192 lb 6.4 oz (87.3 kg)   SpO2 98%   BMI 38.86 kg/m  Vitals:   12/28/18 0818  BP: 122/72  Pulse: 66  Resp: 18  Temp: (!) 96.9 F (36.1 C)  TempSrc: Temporal  SpO2: 98%  Weight: 192 lb 6.4 oz (87.3 kg)  Body mass index is 38.86 kg/m.  Physical Exam Constitutional:      General: She is not in acute distress.    Appearance: She is well-developed.  HENT:     Head: Normocephalic and atraumatic.     Right Ear: Hearing normal.  Left Ear: Hearing normal.     Nose: Nose normal.  Eyes:     General: Lids are normal. No scleral icterus.       Right eye: No discharge.        Left eye: No discharge.     Conjunctiva/sclera: Conjunctivae normal.  Neck:     Musculoskeletal: Normal range of motion.  Cardiovascular:     Rate and Rhythm: Normal rate.     Pulses: Normal pulses.  Pulmonary:     Effort: Pulmonary effort is normal. No respiratory distress.  Musculoskeletal: Normal range of motion.     Comments: Some swelling around the left lateral malleolus and tender to palpate or invert/evert the ankle. Good pulses and sensation.  Skin:    Findings: No lesion or rash.  Neurological:     Mental Status: She is alert and oriented to  person, place, and time.  Psychiatric:        Speech: Speech normal.        Behavior: Behavior normal.        Thought Content: Thought content normal.       Assessment & Plan    1. Acute left ankle pain Inverted the left ankle on 12-21-18 stepping off a step stool needed to work with some files in the top drawer of a file cabinet on the job (W/C injury). No bruising remaining but swelling and pain persists. Will get x-ray evaluation, recommend rest, elevation, ice packs and may need a brace pending reports. Out of work 12-28-18 through 12-31-18. May return to work 01-01-19 if no displaced fracture. Continue Tylenol prn pain. - DG Ankle Complete Left       Dortha Kernennis Chrismon, PA  [facility]

## 2018-12-28 NOTE — Telephone Encounter (Signed)
LMTCB regarding xray results

## 2018-12-28 NOTE — Progress Notes (Signed)
No sleeping well the past several weeks. Did not feel the Prozac was helping and decided to stop it. Will switch to Trazodone and get her to call report of progress in 4 weeks.

## 2018-12-31 NOTE — Telephone Encounter (Signed)
Pt returned missed call. °Please call pt back with results. ° °Thanks, °[facility] °

## 2018-12-31 NOTE — Telephone Encounter (Signed)
Advised 

## 2019-01-29 ENCOUNTER — Other Ambulatory Visit: Payer: Self-pay | Admitting: Family Medicine

## 2019-01-29 ENCOUNTER — Telehealth: Payer: Self-pay | Admitting: Family Medicine

## 2019-01-29 DIAGNOSIS — G4700 Insomnia, unspecified: Secondary | ICD-10-CM

## 2019-01-29 DIAGNOSIS — F418 Other specified anxiety disorders: Secondary | ICD-10-CM

## 2019-01-29 MED ORDER — TRAZODONE HCL 100 MG PO TABS: 100.0000 mg | ORAL_TABLET | Freq: Every evening | ORAL | 3 refills | Status: DC | PRN

## 2019-01-29 NOTE — Telephone Encounter (Signed)
Yes, change Trazodone to 100 mg hs #30 & 3 RF.

## 2019-01-29 NOTE — Telephone Encounter (Signed)
Pt called in to let Simona Huh know that the 100 mg of Trazadone is what works so she will have to have a new rx she to the pharmacy.  [facility]  [place]

## 2019-01-29 NOTE — Telephone Encounter (Signed)
Do you want this to be changed? It looks like the last thing we did was 50 mg 1/2 - 1 at night.

## 2019-01-29 NOTE — Telephone Encounter (Signed)
Done. Durene Cal to [facility].

## 2019-10-04 ENCOUNTER — Other Ambulatory Visit: Payer: Self-pay

## 2019-10-04 ENCOUNTER — Ambulatory Visit (INDEPENDENT_AMBULATORY_CARE_PROVIDER_SITE_OTHER): Payer: 59 | Admitting: Family Medicine

## 2019-10-04 ENCOUNTER — Encounter: Payer: Self-pay | Admitting: Family Medicine

## 2019-10-04 VITALS — BP 115/79 | HR 86 | Temp 96.9°F | Resp 16 | Ht 59.0 in | Wt 191.0 lb

## 2019-10-04 DIAGNOSIS — F424 Excoriation (skin-picking) disorder: Secondary | ICD-10-CM

## 2019-10-04 DIAGNOSIS — G4700 Insomnia, unspecified: Secondary | ICD-10-CM

## 2019-10-04 DIAGNOSIS — R609 Edema, unspecified: Secondary | ICD-10-CM | POA: Diagnosis not present

## 2019-10-04 DIAGNOSIS — J4520 Mild intermittent asthma, uncomplicated: Secondary | ICD-10-CM

## 2019-10-04 DIAGNOSIS — F418 Other specified anxiety disorders: Secondary | ICD-10-CM

## 2019-10-04 MED ORDER — TRAZODONE HCL 100 MG PO TABS: 100.0000 mg | ORAL_TABLET | Freq: Every evening | ORAL | 3 refills | Status: AC | PRN

## 2019-10-04 MED ORDER — ALBUTEROL SULFATE HFA 108 (90 BASE) MCG/ACT IN AERS: INHALATION_SPRAY | RESPIRATORY_TRACT | 6 refills | Status: DC

## 2019-10-04 MED ORDER — FLUTICASONE-SALMETEROL 250-50 MCG/DOSE IN AEPB: INHALATION_SPRAY | RESPIRATORY_TRACT | 3 refills | Status: AC

## 2019-10-04 MED ORDER — FUROSEMIDE 20 MG PO TABS: ORAL_TABLET | ORAL | 0 refills | Status: AC

## 2019-10-04 NOTE — Progress Notes (Signed)
Established patient visit  I,April Miller,acting as a scribe for [employer], [employer].,have documented all relevant documentation on the behalf of [employer],as directed by  [employer] while in the presence of [employer], [place].   Patient: Stephanie Spears   DOB: 05-Sep-1970   49 y.o. Female  MRN: [id number] Visit Date: 10/04/2019  Today's healthcare provider: Dortha Kern, PA   Chief Complaint  Patient presents with  . Follow-up  . Hypertension  . Anxiety  . Depression   Subjective    HPI Anxiety with depression From 11/19/2018-Will get routine labs and start Fluoxetine daily. Recheck pending reports. Patient did not get labs done.   Patient states she has been doing okay on fluoxetine. She has not been havening any issues with the medication.   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 Plan follow up pending reports.   Past Medical History:  Diagnosis Date  . Anxiety   . Asthma    Past Surgical History:  Procedure Laterality Date  . ABDOMINAL HYSTERECTOMY    . ANTERIOR CERVICAL DECOMP/DISCECTOMY FUSION N/A 10/11/2012   Procedure: CERVICAL FIVE-SIX, CERVICAL SIX-SEVEN ANTERIOR CERVICAL DECOMPRESSION/DISCECTOMY FUSION 2 LEVELS;  Surgeon: Karn Cassis, MD;  Location: [facility];  Service: Neurosurgery;  Laterality: N/A;  C56 C67 anterior cervical decompression with fusion plating and bonegraft  . CARPAL TUNNEL RELEASE Right 2011  . INCONTINENCE SURGERY  09-20-2012   Social History   Tobacco Use  . Smoking status: Never Smoker  . Smokeless tobacco: Never Used  Substance Use Topics  . Alcohol use: No  . Drug use: No   Family Status  Relation Name Status  . Mother  Alive  . Father  Alive  . MGM  Alive  . MGF  Deceased  . PGF  Deceased   Allergies  Allergen Reactions  . Aspirin Shortness Of Breath  . Penicillins Anaphylaxis       Medications: Outpatient  Medications Prior to Visit  Medication Sig  . Fluticasone-Salmeterol (ADVAIR DISKUS) 250-50 MCG/DOSE AEPB INHALE 1 PUFF BY MOUTH EVERY 12 HOURS  . furosemide (LASIX) 20 MG tablet take 1 tablet by mouth once daily if needed for edema  . hydrOXYzine (ATARAX/VISTARIL) 50 MG tablet take 1 tablet by mouth three times a day if needed for itching  . SUMAtriptan (IMITREX) 50 MG tablet May repeat in 2 hours if headache persists or recurs. Limit to 2 doses in any 24 hours.  . traZODone (DESYREL) 100 MG tablet Take 1 tablet (100 mg total) by mouth at bedtime as needed for sleep.  . VENTOLIN HFA 108 (90 Base) MCG/ACT inhaler INHALE 2 PUFFS BY MOUTH EVERY 6 HOURS ASNEEDED WHEEZING   Facility-Administered Medications Prior to Visit  Medication Dose Route Frequency Provider  . levalbuterol (XOPENEX) nebulizer solution 1.25 mg  1.25 mg Nebulization Once Dave Mergen, Jodell Cipro, PA    Review of Systems  Constitutional: Negative for appetite change, chills, fatigue and fever.  Respiratory: Negative for chest tightness and shortness of breath.   Cardiovascular: Negative for chest pain and palpitations.  Gastrointestinal: Negative for abdominal pain, nausea and vomiting.  Neurological: Negative for dizziness and weakness.    Last CBC Lab Results  Component Value Date   WBC 6.8 01/09/2017   HGB 14.5 01/09/2017   HCT 42.8 01/09/2017   MCV 84.6 01/09/2017   MCH 28.7 01/09/2017   RDW 13.2 01/09/2017   PLT 244 01/09/2017  Last metabolic panel Lab Results  Component Value Date   GLUCOSE 102 (H) 01/09/2017   NA 139 01/09/2017   K 3.5 01/09/2017   CL 103 01/09/2017   CO2 29 01/09/2017   BUN 11 01/09/2017   CREATININE 0.81 01/09/2017   GFRNONAA >60 01/09/2017   GFRAA >60 01/09/2017   CALCIUM 9.0 01/09/2017   PROT 6.9 05/22/2015   ALBUMIN 4.3 05/22/2015   LABGLOB 2.6 05/22/2015   AGRATIO 1.7 05/22/2015   BILITOT 0.3 05/22/2015   ALKPHOS 74 05/22/2015   AST 18 05/22/2015   ALT 17 05/22/2015    ANIONGAP 7 01/09/2017      Objective    BP 115/79 (BP Location: Right Arm, Patient Position: Sitting, Cuff Size: Large)   Pulse 86   Temp (!) 96.9 F (36.1 C) (Other (Comment))   Resp 16   Ht 4\' 11"  (1.499 m)   Wt 191 lb (86.6 kg)   SpO2 97%   BMI 38.58 kg/m  Wt Readings from Last 3 Encounters:  10/04/19 191 lb (86.6 kg)  12/28/18 192 lb 6.4 oz (87.3 kg)  11/19/18 191 lb (86.6 kg)   Physical Exam Constitutional:      General: She is not in acute distress.    Appearance: She is well-developed.  HENT:     Head: Normocephalic and atraumatic.     Right Ear: Hearing and tympanic membrane normal.     Left Ear: Hearing and tympanic membrane normal.     Nose: Nose normal.     Mouth/Throat:     Mouth: Mucous membranes are moist.     Pharynx: Oropharynx is clear.  Eyes:     General: Lids are normal. No scleral icterus.       Right eye: No discharge.        Left eye: No discharge.     Conjunctiva/sclera: Conjunctivae normal.  Cardiovascular:     Rate and Rhythm: Normal rate and regular rhythm.     Pulses: Normal pulses.     Heart sounds: Normal heart sounds.  Pulmonary:     Effort: Pulmonary effort is normal. No respiratory distress.     Comments: Few slight wheezes without rales or rhonchi. Abdominal:     General: Bowel sounds are normal.     Palpations: Abdomen is soft.  Musculoskeletal:        General: Normal range of motion.     Cervical back: Neck supple.  Skin:    Findings: No lesion or rash.     Comments: Multiple small scars on forearms and a few on posterior thighs from OCD dermatillomania (skin picking).  Neurological:     Mental Status: She is alert and oriented to person, place, and time.     Gait: Gait normal.     Deep Tendon Reflexes: Reflexes normal.  Psychiatric:        Attention and Perception: Attention normal.        Mood and Affect: Mood is anxious.        Speech: Speech normal.        Behavior: Behavior normal.      No results found for any  visits on 10/04/19.  Assessment & Plan     1. Anxiety with depression Has been off medications for a while due to insurance difficulties. No suicidal ideation. Had not been using medication regularly while trying to make her supply last longer. Will refill and check follow up labs. Recheck in 3 months or sooner pending reports. - traZODone (  DESYREL) 100 MG tablet; Take 1 tablet (100 mg total) by mouth at bedtime as needed for sleep.  Dispense: 30 tablet; Refill: 3 - CBC with Differential/Platelet - Comprehensive metabolic panel - TSH  2. Insomnia, persistent Has not had insurance until recently and had difficulty getting medications. Will refill Trazodone for anxiety and sleep disturbance. Recheck routine labs. - traZODone (DESYREL) 100 MG tablet; Take 1 tablet (100 mg total) by mouth at bedtime as needed for sleep.  Dispense: 30 tablet; Refill: 3 - Comprehensive metabolic panel - TSH  3. Peripheral edema No edema today. With anxiety/OCD out of control, diet has been poor and probably excess of salt. Will check labs and refill Lasix for prn use only. - furosemide (LASIX) 20 MG tablet; take 1 tablet by mouth once daily if needed for edema  Dispense: 30 tablet; Refill: 0 - CBC with Differential/Platelet - Comprehensive metabolic panel - TSH  4. Mild intermittent asthma without complication Still having intermittent wheezing with some allergic rhinitis. Zyrtec controls some of the symptoms. Ran out of inhalers. Will refill and follow up in 3 months. 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 Has been off Trazodone and Atarax due to loss of insurance. Regained coverage and  going back on meds. May need follow up with psychiatrist pending recheck in 3 months.   No follow-ups on file.         Vernie Murders, [place] [phone] (phone) [phone] (fax)  [place]

## 2019-11-15 ENCOUNTER — Other Ambulatory Visit: Payer: Self-pay | Admitting: Family Medicine

## 2019-11-15 DIAGNOSIS — F411 Generalized anxiety disorder: Secondary | ICD-10-CM

## 2019-11-15 NOTE — Telephone Encounter (Signed)
Requested medication (s) are due for refill today: Yes  Requested medication (s) are on the active medication list: Yes  Last refill:  06/28/18  Future visit scheduled: No  Notes to clinic:  Prescription has expired.    Requested Prescriptions  Pending Prescriptions Disp Refills   hydrOXYzine (ATARAX/VISTARIL) 50 MG tablet [Pharmacy Med Name: HYDROXYZINE HCL 50 MG TAB] 60 tablet 3    Sig: TAKE 1 TABLET BY MOUTH 3 TIMES DAILY AS NEEDED ITCHING      Ear, Nose, and Throat:  Antihistamines Passed - 11/15/2019 10:28 AM      Passed - Valid encounter within last 12 months    Recent Outpatient Visits           1 month ago Dermatillomania   [facility], [place], [place]   10 months ago Acute left ankle pain   [facility], [place], [place]   12 months ago Anxiety with depression   [facility], [place], [place]   1 year ago Moderate asthma with acute exacerbation, unspecified whether persistent   [facility], Jodell Cipro, [place]   1 year ago Anxiety, generalized   [facility], [place], [place]

## 2020-01-16 ENCOUNTER — Ambulatory Visit (INDEPENDENT_AMBULATORY_CARE_PROVIDER_SITE_OTHER): Payer: Self-pay | Admitting: Family Medicine

## 2020-01-16 ENCOUNTER — Other Ambulatory Visit: Payer: Self-pay

## 2020-01-16 ENCOUNTER — Encounter: Payer: Self-pay | Admitting: Family Medicine

## 2020-01-16 VITALS — BP 125/85 | HR 84 | Temp 98.3°F | Resp 20 | Wt 192.0 lb

## 2020-01-16 DIAGNOSIS — F418 Other specified anxiety disorders: Secondary | ICD-10-CM

## 2020-01-16 DIAGNOSIS — B360 Pityriasis versicolor: Secondary | ICD-10-CM

## 2020-01-16 DIAGNOSIS — F424 Excoriation (skin-picking) disorder: Secondary | ICD-10-CM

## 2020-01-16 MED ORDER — CLOTRIMAZOLE-BETAMETHASONE 1-0.05 % EX CREA: 1.0000 "application " | TOPICAL_CREAM | Freq: Two times a day (BID) | CUTANEOUS | 0 refills | Status: AC

## 2020-01-16 NOTE — Progress Notes (Signed)
Established patient visit   Patient: Stephanie Spears   DOB: 26-Oct-1970   49 y.o. Female  MRN: [id number] Visit Date: 01/16/2020  Today's healthcare provider: Dortha Kern, PA   Chief Complaint  Patient presents with  . Rash   Subjective    Rash This is a new problem. Episode onset: 2 weeks ago. The problem has been gradually worsening since onset. The affected locations include the chest and face. The rash is characterized by redness and itchiness. She was exposed to nothing. Pertinent negatives include no fatigue, fever, shortness of breath or vomiting. Treatments tried: Calomine lotion, oral Benadryl and benadryl cream  The treatment provided no relief.      Past Medical History:  Diagnosis Date  . Anxiety   . Asthma    Past Surgical History:  Procedure Laterality Date  . ABDOMINAL HYSTERECTOMY    . ANTERIOR CERVICAL DECOMP/DISCECTOMY FUSION N/A 10/11/2012   Procedure: CERVICAL FIVE-SIX, CERVICAL SIX-SEVEN ANTERIOR CERVICAL DECOMPRESSION/DISCECTOMY FUSION 2 LEVELS;  Surgeon: Karn Cassis, MD;  Location: [facility];  Service: Neurosurgery;  Laterality: N/A;  C56 C67 anterior cervical decompression with fusion plating and bonegraft  . CARPAL TUNNEL RELEASE Right 2011  . INCONTINENCE SURGERY  09-20-2012   Social History   Tobacco Use  . Smoking status: Never Smoker  . Smokeless tobacco: Never Used  Substance Use Topics  . Alcohol use: No  . Drug use: No   Family Status  Relation Name Status  . Mother  Alive  . Father  Alive  . MGM  Alive  . MGF  Deceased  . PGF  Deceased   Allergies  Allergen Reactions  . Aspirin Shortness Of Breath  . Penicillins Anaphylaxis       Medications: Outpatient Medications Prior to Visit  Medication Sig  . albuterol (VENTOLIN HFA) 108 (90 Base) MCG/ACT inhaler INHALE 2 PUFFS BY MOUTH EVERY 6 HOURS ASNEEDED WHEEZING  . Fluticasone-Salmeterol (ADVAIR DISKUS) 250-50 MCG/DOSE AEPB INHALE 1 PUFF BY MOUTH EVERY 12 HOURS  .  furosemide (LASIX) 20 MG tablet take 1 tablet by mouth once daily if needed for edema  . hydrOXYzine (ATARAX/VISTARIL) 50 MG tablet TAKE 1 TABLET BY MOUTH 3 TIMES DAILY AS NEEDED ITCHING  . SUMAtriptan (IMITREX) 50 MG tablet May repeat in 2 hours if headache persists or recurs. Limit to 2 doses in any 24 hours.  . traZODone (DESYREL) 100 MG tablet Take 1 tablet (100 mg total) by mouth at bedtime as needed for sleep.   Facility-Administered Medications Prior to Visit  Medication Dose Route Frequency Provider  . levalbuterol (XOPENEX) nebulizer solution 1.25 mg  1.25 mg Nebulization Once Grant Swager, Jodell Cipro, PA    Review of Systems  Constitutional: Negative for appetite change, chills, fatigue and fever.  Respiratory: Negative for chest tightness and shortness of breath.   Cardiovascular: Negative for chest pain and palpitations.  Gastrointestinal: Negative for abdominal pain, nausea and vomiting.  Skin: Positive for rash.  Neurological: Negative for dizziness and weakness.      Objective    BP 125/85 (BP Location: Left Arm, Patient Position: Sitting)   Pulse 84   Temp 98.3 F (36.8 C) (Oral)   Resp 20   Wt 192 lb (87.1 kg)   SpO2 98%   BMI 38.78 kg/m    Physical Exam Constitutional:      General: She is not in acute distress.    Appearance: She is well-developed.  HENT:     Head: Normocephalic and  atraumatic.     Right Ear: Hearing normal.     Left Ear: Hearing normal.     Nose: Nose normal.  Eyes:     General: Lids are normal. No scleral icterus.       Right eye: No discharge.        Left eye: No discharge.     Conjunctiva/sclera: Conjunctivae normal.  Pulmonary:     Effort: Pulmonary effort is normal. No respiratory distress.  Musculoskeletal:        General: Normal range of motion.  Skin:    Findings: Rash present. No lesion.     Comments: Tan pruritic rash between breasts and a few pink lesions scattered.  Neurological:     Mental Status: She is alert and  oriented to person, place, and time.  Psychiatric:        Speech: Speech normal.        Behavior: Behavior normal.        Thought Content: Thought content normal.     No results found for any visits on 01/16/20.  Assessment & Plan     1. Tinea versicolor Itchy rash onset the past 2 weeks. Brown pigmentation between breasts and a few scattered pink lesions on upper arms and upper back. May use Selsun Blue and add Lotrisone cream. Try Hydroxyzine for itching. Keep the areas clean and dry. - clotrimazole-betamethasone (LOTRISONE) cream; Apply 1 application topically 2 (two) times daily. (to rash)  Dispense: 30 g; Refill: 0  2. Dermatillomania Still has occasional skin picking tendency. Hydroxyzine continues to help with rest at night and some control of anxiety. Only a few lesions on the right forearm today. No signs of infection.  3. Anxiety with depression Improvement in stress level with change to a less demanding job. Hydroxyzine at bedtime helps with sleep and anxiety. No suicidal ideation. Recheck prn.   No follow-ups on file.      Haywood Pao, PA, have reviewed all documentation for this visit. The documentation on 01/16/20 for the exam, diagnosis, procedures, and orders are all accurate and complete.    Dortha Kern, PA  [facility] [phone] (phone) [phone] (fax)  [facility]

## 2020-02-14 IMAGING — CR DG ANKLE COMPLETE 3+V*L*
1 series · 3 of 3 positions shown · non-contrast
Comparison: None.

CLINICAL DATA: Pain and swelling following twisting injury

EXAM:
LEFT ANKLE COMPLETE - 3+ VIEW

[Series 1: dg ankle complete left · 0.14mm/px · 3 of 3 slices shown]
[im 1/3]
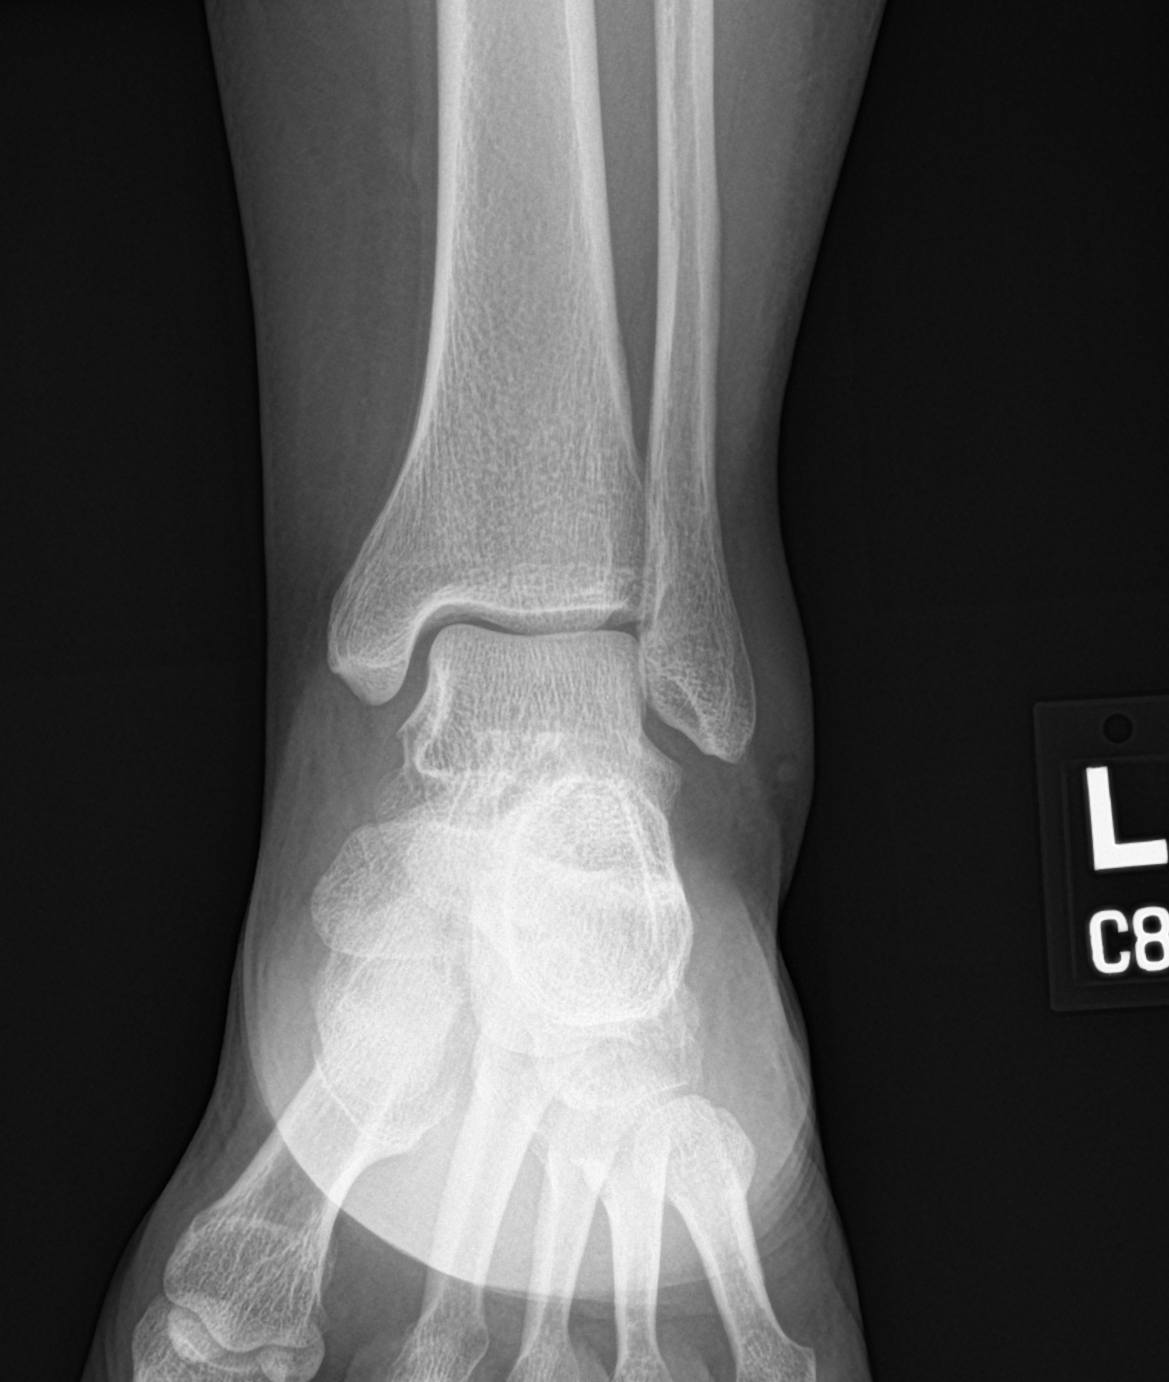
[im 2/3]
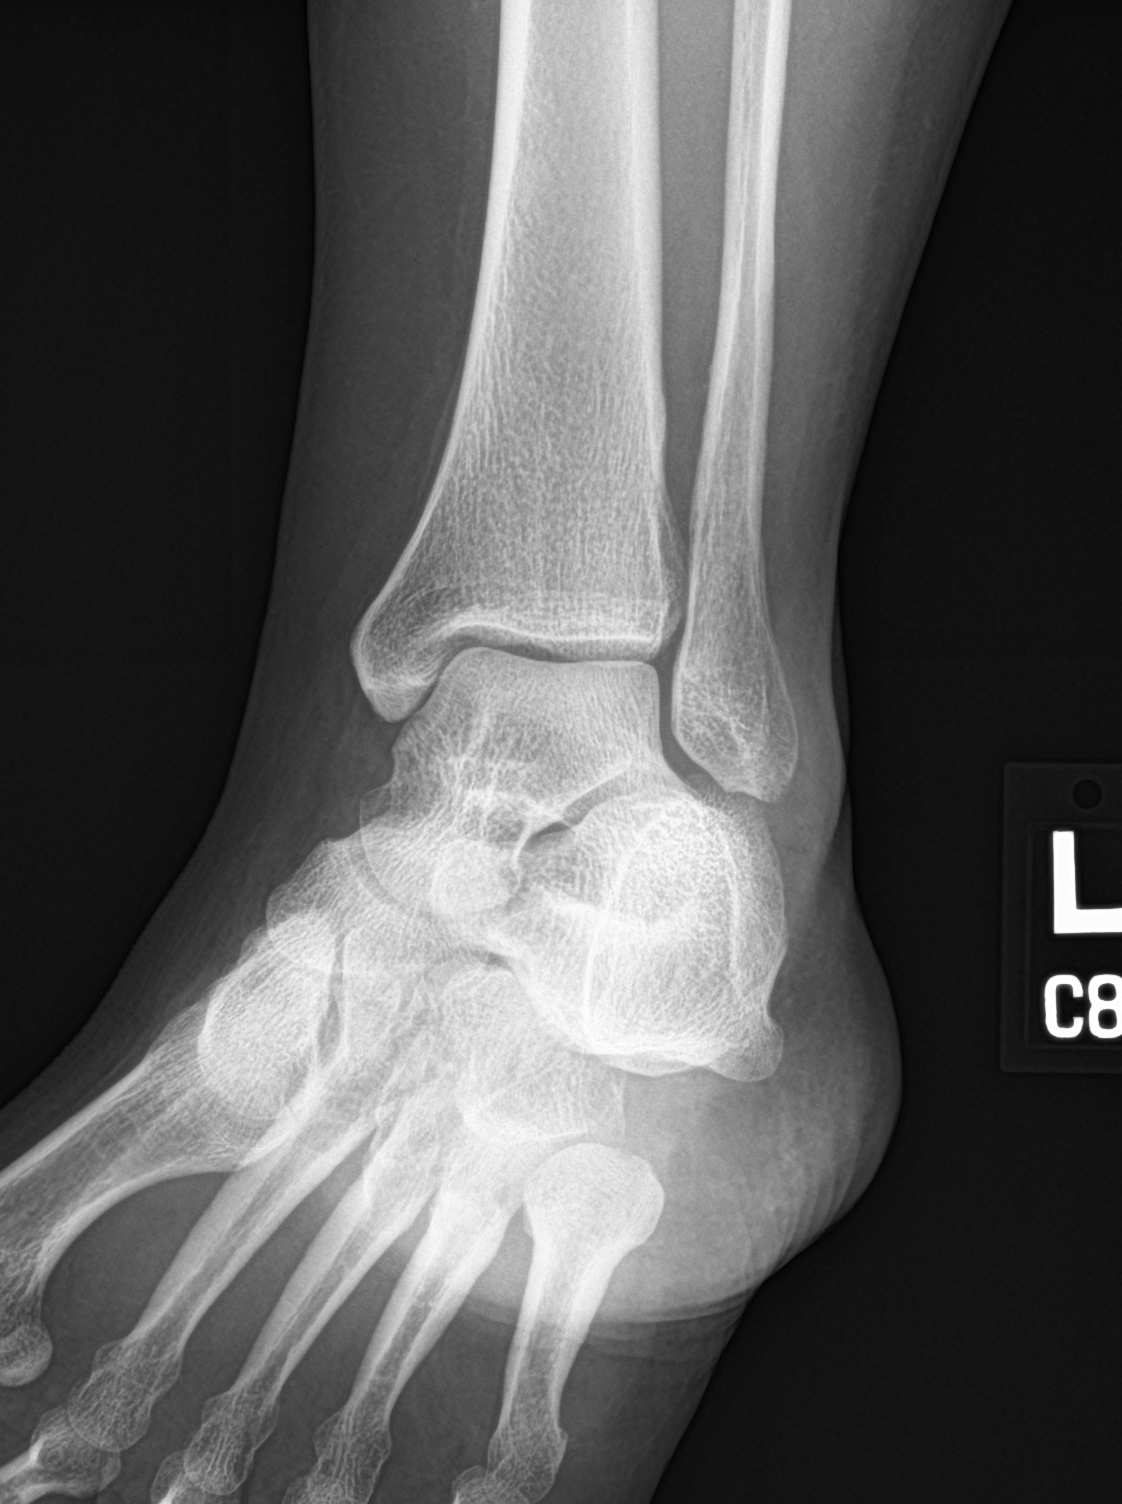
[im 3/3]
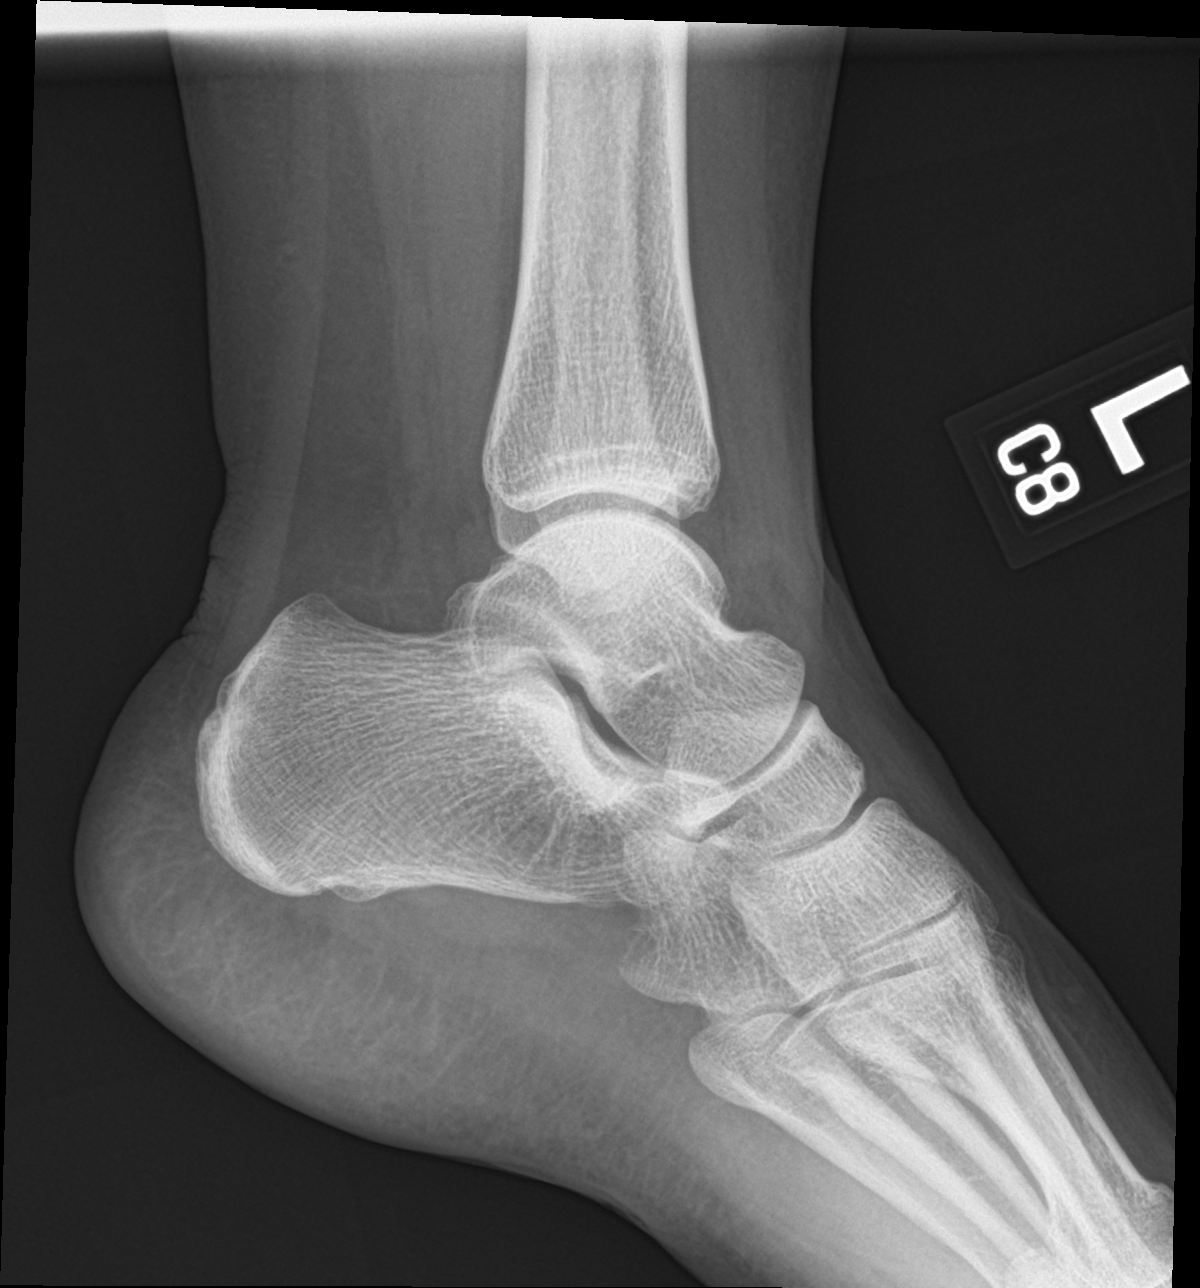

[3 of 3 positions shown; findings below may reference images not displayed]

FINDINGS: Frontal, oblique, and lateral views were obtained. There is soft
tissue swelling. There is no evident fracture or joint effusion.
There is no joint space narrowing or erosion. Ankle mortise appears
intact.
IMPRESSION: Soft tissue swelling. No fracture or appreciable arthropathy. Ankle
mortise appears intact.

## 2020-04-15 ENCOUNTER — Other Ambulatory Visit: Payer: Self-pay | Admitting: Family Medicine

## 2020-04-15 DIAGNOSIS — F411 Generalized anxiety disorder: Secondary | ICD-10-CM

## 2020-08-28 ENCOUNTER — Encounter: Payer: Self-pay | Admitting: Family Medicine

## 2020-08-28 ENCOUNTER — Ambulatory Visit: Payer: Self-pay | Admitting: *Deleted

## 2020-08-28 ENCOUNTER — Other Ambulatory Visit: Payer: Self-pay | Admitting: Family Medicine

## 2020-08-28 NOTE — Telephone Encounter (Signed)
Pt called in c/o coughing up thick yellow and green mucus and having a runny nose since Tuesday.   She has asthma and is having some shortness of breath too.  Using her Advair and rescue inhaler too.  "Maurine Minister usually calls in a Z Pak for me and it takes care of it when I get this way".     There are no appts available at [facility] with any provider within the 24 hr timeframe required on protocol.  I am sending a high priority note to the office to see if she can be worked in.  Someone will call her back.  Pt was agreeable to this plan but mainly prefers the Z Pak to be called in.  Covid questionnaire completed and indicates a virtual visit.   Reason for Disposition . [1] MILD asthma attack (e.g., no SOB at rest, mild SOB with walking, speaks normally in sentences, mild wheezing) AND [2] lasting > 24 hours on prescribed treatment  Answer Assessment - Initial Assessment Questions 1. RESPIRATORY STATUS: "Describe your breathing?" (e.g., wheezing, shortness of breath, unable to speak, severe coughing)      I think I have bronchitis.  Coughing up yellow green mucus.   Runny nose too.   I'm short of breath 2. ONSET: "When did this asthma attack begin?"      Tuesday. I've used my inhaler and used my advair 3. TRIGGER: "What do you think triggered this attack?" (e.g., URI, exposure to pollen or other allergen, tobacco smoke)      I don't know 4. PEAK EXPIRATORY FLOW RATE (PEFR): "Do you use a peak flow meter?" If Yes, ask: "What's the current peak flow? What's your personal best peak flow?"      N/A 5. SEVERITY: "How bad is this attack?"    - MILD: No SOB at rest, mild SOB with walking, speaks normally in sentences, can lie down, no retractions, pulse < 100. (GREEN Zone: PEFR 80-100%)   - MODERATE: SOB at rest, SOB with minimal exertion and prefers to sit, cannot lie down flat, speaks in phrases, mild retractions, audible wheezing, pulse 100-120. (YELLOW Zone: PEFR 50-79%)    -  SEVERE: Struggling for each breath, speaks in single words, struggling to breathe, sitting hunched forward, retractions, usually loud wheezing, sometimes minimal wheezing because of decreased air movement, pulse > 120. (RED Zone: PEFR < 50%).      Short of breath when sitting around 6. ASTHMA MEDICINES:  "What treatments have you given?"    - INHALED QUICK RELIEF (RESCUE): "What is your inhaled quick-relief medicine?" (e.g., albuterol, salbutamol) "Do you use an inhaler or a nebulizer?" "How frequently have you been using?"   - CONTROLLER (LONG-TERM-CONTROL): "Do you take an inhaled steroid? (e.g., Asmanex, Flovent, Pulmicort, Qvar)     Uses Advair and a rescue inhaler 7. INHALED QUICK-RELIEF TREATMENTS FOR THIS ATTACK: "What treatments have you given yourself so far?" and "How many and how often?" If using an inhaler, ask, "How many puffs?" Note: Routine treatments are 2 puffs every 4 hours as needed. Rescue treatments are 4 puffs repeated every 20 minutes, up to three times as needed.      N/A 8. OTHER SYMPTOMS: "Do you have any other symptoms? (e.g., chest pain, coughing up yellow sputum, fever, runny nose)     Coughing up thick yellow green mucus since Tuesday 9. O2 SATURATION MONITOR:  "Do you use an oxygen saturation monitor (pulse oximeter) at home?" If Yes, "What is your reading (  oxygen level) today?" "What is your usual oxygen saturation reading?" (e.g., 95%)     No 10. PREGNANCY: "Is there any chance you are pregnant?" "When was your last menstrual period?"       N/A  Protocols used: ASTHMA ATTACK-A-AH

## 2020-08-31 ENCOUNTER — Other Ambulatory Visit: Payer: Self-pay | Admitting: Family Medicine

## 2020-08-31 DIAGNOSIS — J40 Bronchitis, not specified as acute or chronic: Secondary | ICD-10-CM

## 2020-08-31 MED ORDER — AZITHROMYCIN 250 MG PO TABS: ORAL_TABLET | ORAL | 0 refills | Status: AC

## 2020-08-31 NOTE — Progress Notes (Signed)
a 

## 2020-10-27 ENCOUNTER — Other Ambulatory Visit: Payer: Self-pay | Admitting: Family Medicine

## 2020-10-27 DIAGNOSIS — J4520 Mild intermittent asthma, uncomplicated: Secondary | ICD-10-CM

## 2020-10-27 NOTE — Telephone Encounter (Signed)
Requested medication (s) are due for refill today:   Yes  Requested medication (s) are on the active medication list:   Yes  Future visit scheduled:   No   Last ordered: 10/04/2019 18 g, 6 refills  Returned because Rx expired   Requested Prescriptions  Pending Prescriptions Disp Refills   albuterol (VENTOLIN HFA) 108 (90 Base) MCG/ACT inhaler [Pharmacy Med Name: ALBUTEROL SULFATE HFA 108 (90 BASE)] 18 g 6    Sig: INHALE 2 PUFFS BY MOUTH EVERY 6 HOURS ASNEEDED WHEEZING      Pulmonology:  Beta Agonists Failed - 10/27/2020  1:28 PM      Failed - One inhaler should last at least one month. If the patient is requesting refills earlier, contact the patient to check for uncontrolled symptoms.      Passed - Valid encounter within last 12 months    Recent Outpatient Visits           9 months ago Tinea versicolor   [facility], Jodell Cipro, PA-C   1 year ago Dermatillomania   [facility], Jodell Cipro, PA-C   1 year ago Acute left ankle pain   [facility], Jodell Cipro, PA-C   1 year ago Anxiety with depression   [facility], Jodell Cipro, PA-C   2 years ago Moderate asthma with acute exacerbation, unspecified whether persistent   [facility], Jodell Cipro, [place]

## 2020-11-19 ENCOUNTER — Ambulatory Visit: Payer: Self-pay | Admitting: Family Medicine

## 2020-11-23 ENCOUNTER — Ambulatory Visit: Payer: BC Managed Care – PPO | Admitting: Family Medicine

## 2020-11-23 ENCOUNTER — Other Ambulatory Visit: Payer: Self-pay

## 2021-03-09 ENCOUNTER — Other Ambulatory Visit: Payer: Self-pay | Admitting: Family Medicine

## 2021-03-09 DIAGNOSIS — J4520 Mild intermittent asthma, uncomplicated: Secondary | ICD-10-CM

## 2021-03-10 NOTE — Telephone Encounter (Signed)
Requested medications are due for refill today.  yes  Requested medications are on the active medications list.  yes  Last refill. 10/27/2020  Future visit scheduled.   no  Notes to clinic.  Pt last seen 01/16/2020. PCP listed as Mr. Stephanie Spears. Pt has missed a scheduled appointment.

## 2021-07-14 ENCOUNTER — Other Ambulatory Visit: Payer: Self-pay | Admitting: Family Medicine

## 2021-07-14 DIAGNOSIS — J4520 Mild intermittent asthma, uncomplicated: Secondary | ICD-10-CM

## 2021-11-25 ENCOUNTER — Other Ambulatory Visit: Payer: Self-pay | Admitting: Family Medicine

## 2021-11-25 DIAGNOSIS — J4520 Mild intermittent asthma, uncomplicated: Secondary | ICD-10-CM

## 2021-11-26 ENCOUNTER — Other Ambulatory Visit: Payer: Self-pay | Admitting: Family Medicine

## 2021-11-26 DIAGNOSIS — J4520 Mild intermittent asthma, uncomplicated: Secondary | ICD-10-CM

## 2022-01-31 ENCOUNTER — Other Ambulatory Visit: Payer: Self-pay | Admitting: Family Medicine

## 2022-01-31 DIAGNOSIS — J4520 Mild intermittent asthma, uncomplicated: Secondary | ICD-10-CM

## 2022-02-18 DIAGNOSIS — J209 Acute bronchitis, unspecified: Secondary | ICD-10-CM | POA: Diagnosis not present

## 2022-02-18 DIAGNOSIS — J45998 Other asthma: Secondary | ICD-10-CM | POA: Diagnosis not present

## 2022-03-15 ENCOUNTER — Emergency Department
Admission: EM | Admit: 2022-03-15 | Discharge: 2022-03-15 | Disposition: A | Discharge status: Home / Self Care | Payer: Self-pay

## 2022-03-15 DIAGNOSIS — G43909 Migraine, unspecified, not intractable, without status migrainosus: Secondary | ICD-10-CM | POA: Diagnosis not present

## 2022-03-15 DIAGNOSIS — H1131 Conjunctival hemorrhage, right eye: Secondary | ICD-10-CM | POA: Diagnosis not present

## 2022-03-15 NOTE — ED Notes (Signed)
No answer [phone]

## 2022-04-05 DIAGNOSIS — R051 Acute cough: Secondary | ICD-10-CM | POA: Diagnosis not present

## 2022-06-02 DIAGNOSIS — G4700 Insomnia, unspecified: Secondary | ICD-10-CM | POA: Diagnosis not present

## 2022-06-02 DIAGNOSIS — G43909 Migraine, unspecified, not intractable, without status migrainosus: Secondary | ICD-10-CM | POA: Diagnosis not present

## 2022-06-02 DIAGNOSIS — M5412 Radiculopathy, cervical region: Secondary | ICD-10-CM | POA: Diagnosis not present

## 2022-06-02 DIAGNOSIS — J4540 Moderate persistent asthma, uncomplicated: Secondary | ICD-10-CM | POA: Diagnosis not present

## 2022-06-02 DIAGNOSIS — Z1211 Encounter for screening for malignant neoplasm of colon: Secondary | ICD-10-CM | POA: Diagnosis not present

## 2022-06-02 DIAGNOSIS — L300 Nummular dermatitis: Secondary | ICD-10-CM | POA: Diagnosis not present

## 2022-06-02 DIAGNOSIS — Z0000 Encounter for general adult medical examination without abnormal findings: Secondary | ICD-10-CM | POA: Diagnosis not present

## 2022-06-02 DIAGNOSIS — R69 Illness, unspecified: Secondary | ICD-10-CM | POA: Diagnosis not present

## 2023-06-06 ENCOUNTER — Other Ambulatory Visit: Payer: Self-pay | Admitting: Family Medicine

## 2023-06-06 DIAGNOSIS — Z1231 Encounter for screening mammogram for malignant neoplasm of breast: Secondary | ICD-10-CM

## 2023-06-22 ENCOUNTER — Ambulatory Visit
Admission: RE | Admit: 2023-06-22 | Discharge: 2023-06-22 | Disposition: A | Discharge status: Home / Self Care | Payer: Self-pay | Source: Ambulatory Visit | Attending: Family Medicine | Admitting: Family Medicine

## 2023-06-22 ENCOUNTER — Other Ambulatory Visit: Payer: Self-pay | Admitting: *Deleted

## 2023-06-22 ENCOUNTER — Inpatient Hospital Stay
Admission: RE | Admit: 2023-06-22 | Discharge: 2023-06-22 | Disposition: A | Discharge status: Home / Self Care | Payer: Self-pay | Source: Ambulatory Visit | Attending: Family Medicine | Admitting: Family Medicine

## 2023-06-22 DIAGNOSIS — Z1231 Encounter for screening mammogram for malignant neoplasm of breast: Secondary | ICD-10-CM

## 2023-06-28 ENCOUNTER — Other Ambulatory Visit: Payer: Self-pay | Admitting: Family Medicine

## 2023-06-28 DIAGNOSIS — R928 Other abnormal and inconclusive findings on diagnostic imaging of breast: Secondary | ICD-10-CM

## 2023-07-04 LAB — EXTERNAL GENERIC LAB PROCEDURE: COLOGUARD: POSITIVE — AB

## 2023-07-06 ENCOUNTER — Ambulatory Visit
Admission: RE | Admit: 2023-07-06 | Discharge: 2023-07-06 | Disposition: A | Discharge status: Home / Self Care | Source: Ambulatory Visit | Attending: Family Medicine | Admitting: Family Medicine

## 2023-07-06 DIAGNOSIS — R928 Other abnormal and inconclusive findings on diagnostic imaging of breast: Secondary | ICD-10-CM | POA: Diagnosis present
